# Patient Record
Sex: Female | Born: 1945 | Race: White | Marital: Married | Smoking: Former smoker
Health system: Southern US, Community
[De-identification: ages and names within clinical notes are randomized; demographics above are authoritative.]

## PROBLEM LIST (undated history)

## (undated) DIAGNOSIS — Z87898 Personal history of other specified conditions: Secondary | ICD-10-CM

## (undated) DIAGNOSIS — T7840XA Allergy, unspecified, initial encounter: Secondary | ICD-10-CM

## (undated) DIAGNOSIS — J189 Pneumonia, unspecified organism: Secondary | ICD-10-CM

## (undated) DIAGNOSIS — C801 Malignant (primary) neoplasm, unspecified: Secondary | ICD-10-CM

## (undated) HISTORY — DX: Personal history of other specified conditions: Z87.898

## (undated) HISTORY — DX: Allergy, unspecified, initial encounter: T78.40XA

## (undated) HISTORY — PX: COLPOSCOPY: SHX161

---

## 2008-02-03 ENCOUNTER — Ambulatory Visit: Payer: Self-pay | Admitting: Gastroenterology

## 2011-10-16 ENCOUNTER — Emergency Department: Payer: Self-pay | Admitting: Emergency Medicine

## 2011-10-16 LAB — TROPONIN I: Troponin-I: <0.02 ng/mL

## 2011-10-16 LAB — COMPREHENSIVE METABOLIC PANEL
Albumin: 3.3 g/dL — ABNORMAL LOW (ref 3.4–5.0)
BUN: 8 mg/dL (ref 7–18)
Calcium, Total: 8.3 mg/dL — ABNORMAL LOW (ref 8.5–10.1)
Chloride: 100 mmol/L (ref 98–107)
Co2: 23 mmol/L (ref 21–32)
Creatinine: 0.62 mg/dL (ref 0.60–1.30)
Osmolality: 271 (ref 275–301)
Potassium: 3.3 mmol/L — ABNORMAL LOW (ref 3.5–5.1)
Sodium: 134 mmol/L — ABNORMAL LOW (ref 136–145)
Total Protein: 6.9 g/dL (ref 6.4–8.2)

## 2011-10-16 LAB — CBC
MCH: 33 pg (ref 26.0–34.0)
MCHC: 34.2 g/dL (ref 32.0–36.0)
RBC: 3.73 10*6/uL — ABNORMAL LOW (ref 3.80–5.20)
RDW: 12.5 % (ref 11.5–14.5)
WBC: 8.5 10*3/uL (ref 3.6–11.0)

## 2011-10-22 LAB — CULTURE, BLOOD (SINGLE)

## 2012-06-11 ENCOUNTER — Encounter: Payer: Self-pay | Admitting: Obstetrics and Gynecology

## 2012-06-11 ENCOUNTER — Ambulatory Visit (INDEPENDENT_AMBULATORY_CARE_PROVIDER_SITE_OTHER): Payer: Medicare PPO | Admitting: Obstetrics and Gynecology

## 2012-06-11 VITALS — BP 151/71 | HR 84 | Ht 64.0 in | Wt 100.0 lb

## 2012-06-11 DIAGNOSIS — N644 Mastodynia: Secondary | ICD-10-CM

## 2012-06-11 NOTE — Progress Notes (Signed)
Patient ID: Deborah Harrison, female   DOB: [birth year], 67 y.o.   MRN: [id number] 67 yo recently relocated to [facility] for retirement presenting today for evaluation of left breast pain. Patient reports onset of burning sensation 2 weeks ago which occurs mainly at night or when sitting in a certain position. Patient is otherwise doing well and is without any complaints,  Past Medical History  Diagnosis Date  . Allergy     seasonal  . History of abnormal Pap smear     10 years ago.   Past Surgical History  Procedure Date  . Colposcopy     abnormal pap x 59yrs ago.   Family History  Problem Relation Age of Onset  . Heart disease Mother   . Heart disease Father     heart attack.  . Diabetes Paternal Grandmother    History  Substance Use Topics  . Smoking status: Former Games developer  . Smokeless tobacco: Not on file     Comment: 20yrs ago.  . Alcohol Use: Yes     Comment: social   GENERAL: Well-developed, well-nourished female in no acute distress.  BREASTS: Symmetric in size. No palpable masses or lymphadenopathy, skin changes, or nipple drainage. ABDOMEN: Soft, nontender, nondistended. No organomegaly. EXTREMITIES: No cyanosis, clubbing, or edema, 2+ distal pulses.  A/P 67 yo with left breast pain - Benign breast exam. Mammogram ordered - patient will return for annual exam with [place] or will have it performed by PCP -RTC prn

## 2012-06-22 ENCOUNTER — Encounter: Payer: Self-pay | Admitting: Obstetrics and Gynecology

## 2012-07-18 ENCOUNTER — Other Ambulatory Visit: Payer: Self-pay

## 2013-01-06 ENCOUNTER — Other Ambulatory Visit: Payer: Self-pay

## 2013-04-08 ENCOUNTER — Other Ambulatory Visit: Payer: Self-pay

## 2017-02-12 ENCOUNTER — Other Ambulatory Visit: Payer: Self-pay | Admitting: Family Medicine

## 2017-02-12 DIAGNOSIS — Z1239 Encounter for other screening for malignant neoplasm of breast: Secondary | ICD-10-CM

## 2021-03-06 ENCOUNTER — Other Ambulatory Visit: Payer: Self-pay | Admitting: Family Medicine

## 2021-03-06 DIAGNOSIS — Z1231 Encounter for screening mammogram for malignant neoplasm of breast: Secondary | ICD-10-CM

## 2021-06-26 ENCOUNTER — Other Ambulatory Visit: Payer: Self-pay | Admitting: Surgery

## 2021-06-26 ENCOUNTER — Ambulatory Visit
Admission: RE | Admit: 2021-06-26 | Discharge: 2021-06-26 | Disposition: A | Discharge status: Home / Self Care | Payer: Medicare PPO | Source: Ambulatory Visit | Attending: Surgery | Admitting: Surgery

## 2021-06-26 ENCOUNTER — Ambulatory Visit: Payer: Medicare PPO

## 2021-06-26 DIAGNOSIS — S52531A Colles' fracture of right radius, initial encounter for closed fracture: Secondary | ICD-10-CM

## 2021-06-29 ENCOUNTER — Other Ambulatory Visit
Admission: RE | Admit: 2021-06-29 | Discharge: 2021-06-29 | Disposition: A | Discharge status: Home / Self Care | Payer: Medicare PPO | Source: Ambulatory Visit | Attending: Surgery | Admitting: Surgery

## 2021-06-29 ENCOUNTER — Other Ambulatory Visit: Payer: Self-pay

## 2021-06-29 ENCOUNTER — Other Ambulatory Visit: Payer: Self-pay | Admitting: Surgery

## 2021-06-29 HISTORY — DX: Malignant (primary) neoplasm, unspecified: C80.1

## 2021-06-29 HISTORY — DX: Pneumonia, unspecified organism: J18.9

## 2021-06-29 NOTE — Patient Instructions (Addendum)
Your procedure is scheduled on: 07/03/21 - Tuesday Report to the Registration Desk on the 1st floor of the [facility]. To find out your arrival time, please call [phone] between 1PM - 3PM on: 07/02/21 - Monday  REMEMBER: Instructions that are not followed completely may result in serious medical risk, up to and including death; or upon the discretion of your surgeon and anesthesiologist your surgery may need to be rescheduled.  Do not eat food after midnight the night before surgery.  No gum chewing, lozengers or hard candies.  You may however, drink CLEAR liquids up to 2 hours before you are scheduled to arrive for your surgery. Do not drink anything within 2 hours of your scheduled arrival time.  Clear liquids include: - water  - apple juice without pulp - gatorade (not RED, PURPLE, OR BLUE) - black coffee or tea (Do NOT add milk or creamers to the coffee or tea) Do NOT drink anything that is not on this list.  TAKE THESE MEDICATIONS THE MORNING OF SURGERY WITH A SIP OF WATER: NONE  One week prior to surgery: Stop Anti-inflammatories (NSAIDS) such as Advil, Aleve, Ibuprofen, Motrin, Naproxen, Naprosyn and Aspirin based products such as Excedrin, Goodys Powder, BC Powder.  Stop ANY OVER THE COUNTER supplements until after surgery.  You may however, continue to take Tylenol if needed for pain up until the day of surgery.  No Alcohol for 24 hours before or after surgery.  No Smoking including e-cigarettes for 24 hours prior to surgery.  No chewable tobacco products for at least 6 hours prior to surgery.  No nicotine patches on the day of surgery.  Do not use any "recreational" drugs for at least a week prior to your surgery.  Please be advised that the combination of cocaine and anesthesia may have negative outcomes, up to and including death. If you test positive for cocaine, your surgery will be cancelled.  On the morning of surgery brush your teeth with toothpaste and  water, you may rinse your mouth with mouthwash if you wish. Do not swallow any toothpaste or mouthwash.  Do not wear jewelry, make-up, hairpins, clips or nail polish.  Do not wear lotions, powders, or perfumes.   Do not shave body from the neck down 48 hours prior to surgery just in case you cut yourself which could leave a site for infection.  Also, freshly shaved skin may become irritated if using the CHG soap.  Contact lenses, hearing aids and dentures may not be worn into surgery.  Do not bring valuables to the hospital. [facility] is not responsible for any missing/lost belongings or valuables.   Notify your doctor if there is any change in your medical condition (cold, fever, infection).  Wear comfortable clothing (specific to your surgery type) to the hospital.  After surgery, you can help prevent lung complications by doing breathing exercises.  Take deep breaths and cough every 1-2 hours. Your doctor may order a device called an Incentive Spirometer to help you take deep breaths. When coughing or sneezing, hold a pillow firmly against your incision with both hands. This is called splinting. Doing this helps protect your incision. It also decreases belly discomfort.  If you are being admitted to the hospital overnight, leave your suitcase in the car. After surgery it may be brought to your room.  If you are being discharged the day of surgery, you will not be allowed to drive home. You will need a responsible adult (18 years  or older) to drive you home and stay with you that night.   If you are taking public transportation, you will need to have a responsible adult (18 years or older) with you. Please confirm with your physician that it is acceptable to use public transportation.   Please call the [facility] Dept. at [phone] if you have any questions about these instructions.  Surgery Visitation Policy:  Patients undergoing a surgery or procedure may  have one family member or support person with them as long as that person is not COVID-19 positive or experiencing its symptoms.  That person may remain in the waiting area during the procedure and may rotate out with other people.  Inpatient Visitation:    Visiting hours are 7 a.m. to 8 p.m. Up to two visitors ages 16+ are allowed at one time in a patient room. The visitors may rotate out with other people during the day. Visitors must check out when they leave, or other visitors will not be allowed. One designated support person may remain overnight. The visitor must pass COVID-19 screenings, use hand sanitizer when entering and exiting the patients room and wear a mask at all times, including in the patients room. Patients must also wear a mask when staff or their visitor are in the room. Masking is required regardless of vaccination status.

## 2021-07-03 ENCOUNTER — Ambulatory Visit: Payer: Medicare PPO | Admitting: Certified Registered Nurse Anesthetist

## 2021-07-03 ENCOUNTER — Encounter: Payer: Self-pay | Admitting: Orthopedic Surgery

## 2021-07-03 ENCOUNTER — Ambulatory Visit
Admission: RE | Admit: 2021-07-03 | Discharge: 2021-07-03 | Disposition: A | Discharge status: Home / Self Care | Payer: Medicare PPO | Source: Ambulatory Visit | Attending: Orthopedic Surgery | Admitting: Orthopedic Surgery

## 2021-07-03 ENCOUNTER — Other Ambulatory Visit: Payer: Self-pay

## 2021-07-03 ENCOUNTER — Ambulatory Visit: Payer: Medicare PPO

## 2021-07-03 ENCOUNTER — Encounter
Admission: RE | Disposition: A | Discharge status: Home / Self Care | Payer: Self-pay | Source: Ambulatory Visit | Attending: Orthopedic Surgery

## 2021-07-03 DIAGNOSIS — X58XXXA Exposure to other specified factors, initial encounter: Secondary | ICD-10-CM | POA: Insufficient documentation

## 2021-07-03 DIAGNOSIS — S52531A Colles' fracture of right radius, initial encounter for closed fracture: Secondary | ICD-10-CM | POA: Insufficient documentation

## 2021-07-03 DIAGNOSIS — S52691A Other fracture of lower end of right ulna, initial encounter for closed fracture: Secondary | ICD-10-CM | POA: Insufficient documentation

## 2021-07-03 DIAGNOSIS — M810 Age-related osteoporosis without current pathological fracture: Secondary | ICD-10-CM | POA: Diagnosis not present

## 2021-07-03 DIAGNOSIS — Z9889 Other specified postprocedural states: Secondary | ICD-10-CM

## 2021-07-03 DIAGNOSIS — Z87891 Personal history of nicotine dependence: Secondary | ICD-10-CM | POA: Diagnosis not present

## 2021-07-03 DIAGNOSIS — W010XXA Fall on same level from slipping, tripping and stumbling without subsequent striking against object, initial encounter: Secondary | ICD-10-CM | POA: Diagnosis not present

## 2021-07-03 HISTORY — PX: ORIF WRIST FRACTURE: SHX2133

## 2021-07-03 SURGERY — OPEN REDUCTION INTERNAL FIXATION (ORIF) WRIST FRACTURE
Anesthesia: General | Site: Wrist | Laterality: Right

## 2021-07-03 MED ORDER — GLYCOPYRROLATE 0.2 MG/ML IJ SOLN
INTRAMUSCULAR | Status: DC | PRN
  Administered 2021-07-03: .2 mg via INTRAVENOUS

## 2021-07-03 MED ORDER — DEXAMETHASONE SODIUM PHOSPHATE 10 MG/ML IJ SOLN
INTRAMUSCULAR | Status: DC | PRN
  Administered 2021-07-03: 5 mg via INTRAVENOUS

## 2021-07-03 MED ORDER — HYDROCODONE-ACETAMINOPHEN 5-325 MG PO TABS: 1.0000 | ORAL_TABLET | Freq: Four times a day (QID) | ORAL | 0 refills | Status: DC | PRN

## 2021-07-03 MED ORDER — CLINDAMYCIN PHOSPHATE 600 MG/50ML IV SOLN
INTRAVENOUS | Status: AC
  Filled 2021-07-03: qty 50

## 2021-07-03 MED ORDER — ORAL CARE MOUTH RINSE: 15.0000 mL | Freq: Once | OROMUCOSAL | Status: AC

## 2021-07-03 MED ORDER — ONDANSETRON HCL 4 MG/2ML IJ SOLN
INTRAMUSCULAR | Status: DC | PRN
  Administered 2021-07-03: 4 mg via INTRAVENOUS

## 2021-07-03 MED ORDER — OXYCODONE HCL 5 MG PO TABS
ORAL_TABLET | ORAL | Status: AC
  Administered 2021-07-03: 5 mg via ORAL
  Filled 2021-07-03: qty 1

## 2021-07-03 MED ORDER — OXYCODONE HCL 5 MG PO TABS: 5.0000 mg | ORAL_TABLET | ORAL | Status: AC

## 2021-07-03 MED ORDER — LIDOCAINE HCL (CARDIAC) PF 100 MG/5ML IV SOSY
PREFILLED_SYRINGE | INTRAVENOUS | Status: DC | PRN
  Administered 2021-07-03: 50 mg via INTRAVENOUS

## 2021-07-03 MED ORDER — GLYCOPYRROLATE 0.2 MG/ML IJ SOLN
INTRAMUSCULAR | Status: AC
  Filled 2021-07-03: qty 1

## 2021-07-03 MED ORDER — FENTANYL CITRATE (PF) 100 MCG/2ML IJ SOLN
INTRAMUSCULAR | Status: DC | PRN
  Administered 2021-07-03 (×4): 25 ug via INTRAVENOUS

## 2021-07-03 MED ORDER — PROPOFOL 10 MG/ML IV BOLUS
INTRAVENOUS | Status: DC | PRN
  Administered 2021-07-03: 100 mg via INTRAVENOUS

## 2021-07-03 MED ORDER — ONDANSETRON HCL 4 MG/2ML IJ SOLN
INTRAMUSCULAR | Status: AC
  Filled 2021-07-03: qty 2

## 2021-07-03 MED ORDER — ONDANSETRON HCL 4 MG/2ML IJ SOLN
4.0000 mg | Freq: Once | INTRAMUSCULAR | Status: AC | PRN
  Administered 2021-07-03: 4 mg via INTRAVENOUS

## 2021-07-03 MED ORDER — PHENYLEPHRINE HCL (PRESSORS) 10 MG/ML IV SOLN
INTRAVENOUS | Status: DC | PRN
  Administered 2021-07-03: 100 ug via INTRAVENOUS

## 2021-07-03 MED ORDER — 0.9 % SODIUM CHLORIDE (POUR BTL) OPTIME
TOPICAL | Status: DC | PRN
  Administered 2021-07-03: 75 mL

## 2021-07-03 MED ORDER — FENTANYL CITRATE (PF) 100 MCG/2ML IJ SOLN
INTRAMUSCULAR | Status: AC
  Administered 2021-07-03: 25 ug via INTRAVENOUS
  Filled 2021-07-03: qty 2

## 2021-07-03 MED ORDER — LIDOCAINE HCL (PF) 2 % IJ SOLN
INTRAMUSCULAR | Status: AC
  Filled 2021-07-03: qty 5

## 2021-07-03 MED ORDER — CHLORHEXIDINE GLUCONATE 0.12 % MT SOLN: 15.0000 mL | Freq: Once | OROMUCOSAL | Status: AC

## 2021-07-03 MED ORDER — ONDANSETRON 4 MG PO TBDP: 4.0000 mg | ORAL_TABLET | Freq: Three times a day (TID) | ORAL | 1 refills | Status: DC | PRN

## 2021-07-03 MED ORDER — CLINDAMYCIN PHOSPHATE 900 MG/50ML IV SOLN
INTRAVENOUS | Status: AC
  Filled 2021-07-03: qty 50

## 2021-07-03 MED ORDER — PROPOFOL 500 MG/50ML IV EMUL
INTRAVENOUS | Status: DC | PRN
  Administered 2021-07-03: 75 ug/kg/min via INTRAVENOUS

## 2021-07-03 MED ORDER — LACTATED RINGERS IV SOLN: INTRAVENOUS | Status: DC

## 2021-07-03 MED ORDER — KETOROLAC TROMETHAMINE 30 MG/ML IJ SOLN
INTRAMUSCULAR | Status: AC
  Filled 2021-07-03: qty 1

## 2021-07-03 MED ORDER — ACETAMINOPHEN 10 MG/ML IV SOLN
INTRAVENOUS | Status: DC | PRN
  Administered 2021-07-03: 750 mg via INTRAVENOUS

## 2021-07-03 MED ORDER — DEXMEDETOMIDINE (PRECEDEX) IN NS 20 MCG/5ML (4 MCG/ML) IV SYRINGE
PREFILLED_SYRINGE | INTRAVENOUS | Status: DC | PRN
  Administered 2021-07-03 (×3): 4 ug via INTRAVENOUS

## 2021-07-03 MED ORDER — FENTANYL CITRATE (PF) 100 MCG/2ML IJ SOLN
25.0000 ug | INTRAMUSCULAR | Status: DC | PRN
  Administered 2021-07-03 (×4): 25 ug via INTRAVENOUS

## 2021-07-03 MED ORDER — FAMOTIDINE 20 MG PO TABS
ORAL_TABLET | ORAL | Status: AC
  Filled 2021-07-03: qty 1

## 2021-07-03 MED ORDER — ACETAMINOPHEN 10 MG/ML IV SOLN
INTRAVENOUS | Status: AC
  Filled 2021-07-03: qty 100

## 2021-07-03 MED ORDER — CLINDAMYCIN PHOSPHATE 900 MG/50ML IV SOLN
900.0000 mg | INTRAVENOUS | Status: AC
  Administered 2021-07-03: 900 mg via INTRAVENOUS

## 2021-07-03 MED ORDER — CHLORHEXIDINE GLUCONATE 0.12 % MT SOLN
OROMUCOSAL | Status: AC
  Administered 2021-07-03: 15 mL via OROMUCOSAL
  Filled 2021-07-03: qty 15

## 2021-07-03 MED ORDER — KETOROLAC TROMETHAMINE 15 MG/ML IJ SOLN
15.0000 mg | Freq: Once | INTRAMUSCULAR | Status: AC
  Administered 2021-07-03: 15 mg via INTRAVENOUS

## 2021-07-03 MED ORDER — FAMOTIDINE 20 MG PO TABS: 20.0000 mg | ORAL_TABLET | Freq: Once | ORAL | Status: DC

## 2021-07-03 MED ORDER — FENTANYL CITRATE (PF) 100 MCG/2ML IJ SOLN
INTRAMUSCULAR | Status: AC
  Filled 2021-07-03: qty 2

## 2021-07-03 SURGICAL SUPPLY — 45 items
APL PRP STRL LF DISP 70% ISPRP (MISCELLANEOUS) ×1
BIT DRILL 2 FAST STEP (BIT) ×1 IMPLANT
BIT DRILL 2.5X4 QC (BIT) ×1 IMPLANT
BNDG CMPR STD VLCR NS LF 5.8X4 (GAUZE/BANDAGES/DRESSINGS) ×1
BNDG ELASTIC 4X5.8 VLCR NS LF (GAUZE/BANDAGES/DRESSINGS) ×2 IMPLANT
BNDG ELASTIC 4X5.8 VLCR STR LF (GAUZE/BANDAGES/DRESSINGS) ×1 IMPLANT
CHLORAPREP W/TINT 26 (MISCELLANEOUS) ×2 IMPLANT
CUFF TOURN SGL QUICK 18X4 (TOURNIQUET CUFF) IMPLANT
DRAPE FLUOR MINI C-ARM 54X84 (DRAPES) ×2 IMPLANT
ELECT CAUTERY BLADE 6.4 (BLADE) ×2 IMPLANT
ELECT REM PT RETURN 9FT ADLT (ELECTROSURGICAL) ×2
ELECTRODE REM PT RTRN 9FT ADLT (ELECTROSURGICAL) ×1 IMPLANT
GAUZE SPONGE 4X4 12PLY STRL (GAUZE/BANDAGES/DRESSINGS) ×2 IMPLANT
GAUZE XEROFORM 1X8 LF (GAUZE/BANDAGES/DRESSINGS) ×2 IMPLANT
GLOVE SURG SYN 9.0  PF PI (GLOVE) ×1
GLOVE SURG SYN 9.0 PF PI (GLOVE) ×1 IMPLANT
GOWN SRG 2XL LVL 4 RGLN SLV (GOWNS) ×1 IMPLANT
GOWN STRL NON-REIN 2XL LVL4 (GOWNS) ×2
GOWN STRL REUS W/ TWL LRG LVL3 (GOWN DISPOSABLE) ×1 IMPLANT
GOWN STRL REUS W/TWL LRG LVL3 (GOWN DISPOSABLE) ×2
K-WIRE 1.6 (WIRE) ×2
K-WIRE FX5X1.6XNS BN SS (WIRE) ×1
KIT TURNOVER KIT A (KITS) ×2 IMPLANT
KWIRE FX5X1.6XNS BN SS (WIRE) IMPLANT
MANIFOLD NEPTUNE II (INSTRUMENTS) ×2 IMPLANT
NDL FILTER BLUNT 18X1 1/2 (NEEDLE) ×1 IMPLANT
NEEDLE FILTER BLUNT 18X 1/2SAF (NEEDLE)
NEEDLE FILTER BLUNT 18X1 1/2 (NEEDLE) IMPLANT
NS IRRIG 500ML POUR BTL (IV SOLUTION) ×2 IMPLANT
PACK EXTREMITY ARMC (MISCELLANEOUS) ×2 IMPLANT
PAD CAST CTTN 4X4 STRL (SOFTGOODS) ×1 IMPLANT
PADDING CAST COTTON 4X4 STRL (SOFTGOODS) ×2
PEG SUBCHONDRAL SMOOTH 2.0X14 (Peg) ×2 IMPLANT
PEG SUBCHONDRAL SMOOTH 2.0X16 (Peg) ×1 IMPLANT
PEG SUBCHONDRAL SMOOTH 2.0X18 (Peg) ×2 IMPLANT
PEG SUBCHONDRAL SMOOTH 2.0X20 (Peg) ×1 IMPLANT
PLATE STAN 21.6X57.2 NRRW RT (Plate) ×1 IMPLANT
SCALPEL PROTECTED #15 DISP (BLADE) ×4 IMPLANT
SCREW CORT 3.5X10 LNG (Screw) ×3 IMPLANT
SPLINT CAST 1 STEP 3X12 (MISCELLANEOUS) ×2 IMPLANT
SUT ETHILON 4-0 (SUTURE) ×2
SUT ETHILON 4-0 FS2 18XMFL BLK (SUTURE) ×1
SUT VICRYL 3-0 27IN (SUTURE) ×2 IMPLANT
SUTURE ETHLN 4-0 FS2 18XMF BLK (SUTURE) ×1 IMPLANT
SYR 3ML LL SCALE MARK (SYRINGE) ×1 IMPLANT

## 2021-07-03 NOTE — H&P (Signed)
Chief Complaint  Patient presents with   Right Wrist Fracture  [place] [address] on 06/21/2021   Deborah Harrison is a 76 y.o. female who presents today for evaluation of a right wrist injury sustained on 06/21/2021. The patient was outside walking for exercise when she stepped in a pothole, she slipped and fell. When she felt the patient believes she may have landed on her right wrist and she had immediate pain and discomfort. The patient did not lose consciousness, she did not feel a pop in the right wrist. She was evaluated at the walk-in clinic where x-rays of the right wrist demonstrated a displaced fracture involving the distal radius with dorsal angulation. There also appeared to be a minimally displaced scaphoid fracture and ulnar styloid fracture. The patient was placed in a sugar-tong splint and instructed to follow-up orthopedics. The patient denies any numbness or tingling to the right upper extremity. She denies any previous injury to the right wrist or arm. The patient denies any surgical history to the right wrist. She is right-hand dominant. She does still exercise on a routine basis, she drives herself and also does yoga. She states that she uses her right hand for a significant mount of activities during today. The patient denies any personal history of heart attack, stroke, asthma or COPD. No personal history of blood clots. The patient does have a history of osteoporosis.  Answers for HPI/ROS submitted by the patient on 06/26/2021 How did your symptoms begin?: suddenly with injury How long ago did your symptoms begin?: 5 Days Please indicate all symptoms related to your issue: pain, swelling Where is your pain located? (select all that apply): right wrist Please select what best describes your pain (choose all that apply): aching, throbbing Please choose the severity that best describes your pain: moderate Rate the pain on a scale of 0 (no pain) to 10 (worst pain ever): 5/10 How often does the pain  occur?: constant Since the onset of your problem, has the pain improved, worsened, or stayed the same?: about the same as when symptoms started Do your symptoms wake you from sleep?: Yes Please choose all the items that worsen your symptoms: activity with limb Please choose all the items that improve your symptoms: acetaminophen, cast, elevation, ice Please indicate any studies you've had for this problem (select all that apply): X-rays When was the date of your injury?: 06/21/2021 Is this a work-related injury?: No Have you filed a worker's comp claim?: No  Past Medical History: Past Medical History:  Diagnosis Date   Chicken pox   Mitral valve prolapse   Osteoporosis   Viral pneumonia  History of lung collapse   Past Surgical History: Past Surgical History:  Procedure Laterality Date   TONSILLECTOMY   Past Family History: Family History  Problem Relation Age of Onset   High blood pressure (Hypertension) Mother   Osteoporosis (Thinning of bones) Mother   Coronary Artery Disease (Blocked arteries around heart) Father   Myocardial Infarction (Heart attack) Father   Breast cancer Maternal Grandmother   Diabetes Paternal Grandmother   Breast cancer Paternal Grandmother   Medications: Current Outpatient Medications Ordered in Epic  Medication Sig Dispense Refill   acetaminophen (TYLENOL) 325 MG tablet Take 650 mg by mouth as needed for Pain   cholecalciferol (VITAMIN D3) 2,000 unit capsule Take 2,000 Units by mouth once daily   diphenhydrAMINE (BENADRYL) 25 mg capsule Take 25 mg by mouth once daily as needed for Itching.   hawthorn 500 mg Cap  Take 1 capsule by mouth once daily.   No current Epic-ordered facility-administered medications on file.   Allergies: Allergies  Allergen Reactions   Penicillins Rash   Erythromycin Other (See Comments)  Other reaction(s): GI Intolerance   Penicillin G Unknown    Review of Systems:  A comprehensive 14 point ROS was performed,  reviewed by me today, and the pertinent orthopaedic findings are documented in the HPI.  Exam: BP (!) 162/86   Ht 162.6 cm (5\' 4" )   Wt (!) 43.2 kg (95 lb 3.2 oz)   BMI 16.34 kg/m  General/Constitutional: The patient appears to be well-nourished, well-developed, and in no acute distress. Neuro/Psych: Normal mood and affect, oriented to person, place and time. Eyes: Non-icteric. Pupils are equal, round, and reactive to light, and exhibit synchronous movement. ENT: Unremarkable. Lymphatic: No palpable adenopathy. Respiratory: Lungs clear to auscultation, Normal chest excursion, No wheezes and Non-labored breathing Cardiovascular: Regular rate and rhythm. No murmurs. and No edema, swelling or tenderness, except as noted in detailed exam. Integumentary: No impressive skin lesions present, except as noted in detailed exam. Musculoskeletal: Unremarkable, except as noted in detailed exam.  The patient presents today with the sugar-tong splint intact to the right upper extremity. Splint was removed at today's visit. Skin examination of the right arm does reveal moderate ecchymosis throughout the dorsal aspect of the wrist and along the ulnar aspect of the forearm. There does appear to be a small wound along the lateral aspect of the elbow which appears to be the beginning stages of a pressure type injury over the lateral epicondyle. No break of the skin is identified, mild erythema is noted. She is able to fully flex and extend the right elbow without discomfort. She does report tenderness to palpation along the distal radius and along the distal ulna. The patient does have some mild discomfort palpation along the anatomic snuffbox. Increased discomfort with gentle thumb flexion and extension exercises. The patient is able to pronate the right forearm without any discomfort, limited supination. Wrist flexion and extension was not evaluated at today's visit. Cap refills intact each individual digit. The  patient is intact light touch throughout the right upper extremity, no evidence of acute carpal tunnel syndrome. Forearm is soft to palpation, no evidence of acute compartment syndrome. Radial pulses intact to the right wrist. The patient was placed in a short arm thumb spica cast prior to leaving today's visit.  Imaging: AP, lateral and oblique images of the right wrist with scaphoid views were obtained today in the office and reviewed by me. These x-rays demonstrate evidence of a dorsally angulated, impacted and displaced intra-articular distal radius fracture. There does not appear to be any significant shifting since initial x-rays. The patient also has evidence of a nondisplaced ulnar styloid fracture. On scaphoid view there does appear to be a at most minimally displaced mid body fracture of the scaphoid however this is not visible on every view.  Impression: Closed Colles' fracture of right radius, initial encounter [S52.531A] Closed Colles' fracture of right radius, initial encounter (primary encounter diagnosis) Other closed fracture of distal end of right ulna, initial encounter Closed nondisplaced fracture of middle third of scaphoid bone of right wrist, initial encounter  Plan:  1. Treatment options were discussed today with the patient. 2. X-rays of the right wrist with scaphoid view does reveal evidence of a displaced and impacted distal radius fracture and questionable scaphoid fracture. 3. The patient was offered and placed in a short arm thumb  spica cast for additional protection following today's visit. Stat CT scan of the right wrist has been ordered for evaluation of scaphoid fracture. 4. The patient is right-hand dominant and is extremely active, discussed the risk and benefits of both surgical intervention and nonsurgical intervention for the wrist fracture. 5. After discussion of the risk and benefits the patient would like to proceed with a right distal radius fracture open  reduction internal fixation, if CT scan is positive for a scaphoid fracture we will also proceed with a percutaneous screw fixation of scaphoid fracture. Surgery will be performed by Dr. Roland Rack, plan will be to schedule this patient for surgery next Tuesday, 07/03/2021. 6. This document will serve as a surgical history and physical for the patient. 7. The patient will follow-up per standard postop protocol. They can call the clinic they have any questions, new symptoms develop or symptoms worsen.  The procedure was discussed with the patient, as were the potential risks (including bleeding, infection, nerve and/or blood vessel injury, persistent or recurrent pain, failure of the repair, nonunion, progression of arthritis, need for further surgery, blood clots, strokes, heart attacks and/or arhythmias, pneumonia, etc.) and benefits. The patient states her understanding and wishes to proceed.  Addendum 5:01pm on 06/26/21 CT scan of the right wrist was negative for evidence of acute scaphoid fracture. Called and spoke with the patient and relayed the imaging report. Discussed with the patient plan will be to proceed with open reduction total fixation of the right distal radius fracture with Dr. Roland Rack. The percutaneous screw fixation that was discussed at her visit will not be necessary.  This office visit took 60 minutes, of which >50% involved patient counseling/education.  Review of the [place] CSRS was performed in accordance of the [place] prior to dispensing any controlled drugs.  This note was generated in part with voice recognition software and I apologize for any typographical errors that were not detected and corrected.  Raquel James, PA-C, CAQ-OS [place]  Electronically signed by Lattie Corns, PA at 06/26/2021 5:04 PM EST  Reviewed  H+P. CT scan showed no scaphoid fracture.  Because of emergencies from last night for Dr. Roland Rack I will be performing the surgery.

## 2021-07-03 NOTE — Op Note (Signed)
07/03/2021  1:10 PM  PATIENT:  Deborah Harrison  76 y.o. female  PRE-OPERATIVE DIAGNOSIS:  Closed Colles' fracture of right radius, initial encounter S52.531A Other closed fracture of distal end of right ulna, initial encounter S52.691A   POST-OPERATIVE DIAGNOSIS:  Closed Colles' fracture of right radius, initial encounter S52.531A Other closed fracture of distal end of right ulna, initial encounter S52.691A  PROCEDURE:  Procedure(s): OPEN REDUCTION INTERNAL FIXATION (ORIF) WRIST FRACTURE (Right)  SURGEON: Laurene Footman, MD  ASSISTANTS: None  ANESTHESIA:   general  EBL:  Total I/O In: 650 [I.V.:600; IV Piggyback:50] Out: 20 [Blood:20]  BLOOD ADMINISTERED:none  DRAINS: none   LOCAL MEDICATIONS USED:  NONE  SPECIMEN:  No Specimen  DISPOSITION OF SPECIMEN:  N/A  COUNTS:  YES  TOURNIQUET:   Total Tourniquet Time Documented: Upper Arm (Right) - 17 minutes Total: Upper Arm (Right) - 17 minutes   IMPLANTS: [employer] innovations narrow standard right plate with multiple smooth pegs and screws  DICTATION: .Dragon Dictation patient was brought to the operating room and after adequate general anesthesia was obtained the right arm was prepped and draped in the usual sterile fashion with a tourniquet applied the upper arm.  After patient identification and timeout procedures were completed tourniquet was raised and a volar approach made centered over the FCR tendon.  Tendon sheath incised and the tendon retracted radially with the radial artery and veins with deep fascia incised and the pronator elevated and used to help retract the ulnar-sided structures with deep retractor placed.  Periosteal elevator used to expose distal and proximal fragments.  Fingertrap traction was applied to the start of the case with 10 pounds off the end of the table this helped restore length a distal first approach was utilized first pinning the plate in position distally ensure is in the appropriate spot and then  filling the 6 distal peg holes with smooth pegs care being taken with multiple views to not penetrate the joint.  After these been placed the plate was brought down to the shaft with a 4 hole plate used but only 3 holes required.  The screw holes were drilled and then measured and 10 mm cortical screw was placed.  This gave essentially anatomic alignment with restoration of volar tilt and radial inclination as well as length.  The traction was removed and under fluoroscopic views there is stability of the fracture with range of motion and no penetration of any pegs into the joint.  The wound is irrigated and tourniquet let down there is minimal bleeding the wound was closed with 3-0 Vicryl subcutaneously and 4-0 nylon for the skin followed by Xeroform 4 x 4 web roll volar splint and Ace wrap  PLAN OF CARE: Discharge to home after PACU  PATIENT DISPOSITION:  PACU - hemodynamically stable.

## 2021-07-03 NOTE — Anesthesia Postprocedure Evaluation (Signed)
Anesthesia Post Note  Patient: Deborah Harrison  Procedure(s) Performed: OPEN REDUCTION INTERNAL FIXATION (ORIF) WRIST FRACTURE (Right: Wrist)  Patient location during evaluation: PACU Anesthesia Type: General Level of consciousness: awake and alert Pain management: pain level controlled Vital Signs Assessment: post-procedure vital signs reviewed and stable Respiratory status: spontaneous breathing, nonlabored ventilation, respiratory function stable and patient connected to nasal cannula oxygen Cardiovascular status: blood pressure returned to baseline and stable Postop Assessment: no apparent nausea or vomiting Anesthetic complications: no   No notable events documented.   Last Vitals:  Vitals:   07/03/21 1415 07/03/21 1433  BP: 132/73 (!) 156/77  Pulse: 76 61  Resp: 13 16  Temp: 36.8 C (!) 36.1 C  SpO2: 98% 96%    Last Pain:  Vitals:   07/03/21 1433  TempSrc: Temporal  PainSc: [name]

## 2021-07-03 NOTE — Anesthesia Procedure Notes (Signed)
Procedure Name: LMA Insertion Date/Time: 07/03/2021 12:22 PM Performed by: Bea Graff, RN Pre-anesthesia Checklist: Patient identified, Patient being monitored, Timeout performed, Emergency Drugs available and Suction available Patient Re-evaluated:Patient Re-evaluated prior to induction Oxygen Delivery Method: Circle system utilized Preoxygenation: Pre-oxygenation with 100% oxygen Induction Type: IV induction Ventilation: Mask ventilation without difficulty LMA: LMA inserted LMA Size: 3.0 Tube type: Oral Number of attempts: 1 Placement Confirmation: positive ETCO2 and breath sounds checked- equal and bilateral Tube secured with: Tape Dental Injury: Teeth and Oropharynx as per pre-operative assessment

## 2021-07-03 NOTE — Anesthesia Preprocedure Evaluation (Signed)
Anesthesia Evaluation  Patient identified by MRN, date of birth, ID band Patient awake    Reviewed: Allergy & Precautions, H&P , NPO status , Patient's Chart, lab work & pertinent test results, reviewed documented beta blocker date and time   Airway Mallampati: II  TM Distance: >3 FB Neck ROM: full    Dental  (+) Teeth Intact   Pulmonary neg pulmonary ROS, pneumonia, resolved, former smoker,    Pulmonary exam normal        Cardiovascular Exercise Tolerance: Good negative cardio ROS Normal cardiovascular exam Rate:Normal     Neuro/Psych negative neurological ROS  negative psych ROS   GI/Hepatic negative GI ROS, Neg liver ROS,   Endo/Other  negative endocrine ROS  Renal/GU negative Renal ROS  negative genitourinary   Musculoskeletal   Abdominal   Peds  Hematology negative hematology ROS (+)   Anesthesia Other Findings   Reproductive/Obstetrics negative OB ROS                             Anesthesia Physical Anesthesia Plan  ASA: 2  Anesthesia Plan: General LMA   Post-op Pain Management:    Induction:   PONV Risk Score and Plan: 4 or greater  Airway Management Planned:   Additional Equipment:   Intra-op Plan:   Post-operative Plan:   Informed Consent: I have reviewed the patients History and Physical, chart, labs and discussed the procedure including the risks, benefits and alternatives for the proposed anesthesia with the patient or authorized representative who has indicated his/her understanding and acceptance.       Plan Discussed with: CRNA  Anesthesia Plan Comments:         Anesthesia Quick Evaluation

## 2021-07-03 NOTE — Transfer of Care (Signed)
Immediate Anesthesia Transfer of Care Note  Patient: Deborah Harrison  Procedure(s) Performed: OPEN REDUCTION INTERNAL FIXATION (ORIF) WRIST FRACTURE (Right: Wrist)  Patient Location: PACU  Anesthesia Type:General  Level of Consciousness: awake, alert  and oriented  Airway & Oxygen Therapy: Patient Spontanous Breathing and Patient connected to face mask oxygen  Post-op Assessment: Report given to RN and Post -op Vital signs reviewed and stable  Post vital signs: Reviewed and stable  Last Vitals:  Vitals Value Taken Time  BP 126/68 07/03/21 1309  Temp    Pulse 74 07/03/21 1312  Resp 14 07/03/21 1312  SpO2 97 % 07/03/21 1312  Vitals shown include unvalidated device data.  Last Pain:  Vitals:   07/03/21 1214  TempSrc: Oral  PainSc: 0-No pain         Complications: No notable events documented.

## 2021-07-03 NOTE — Discharge Instructions (Addendum)
Keep arm elevated is much as possible the next 2 to 3 days until recheck Work your fingers all you can with splint on Loosen Ace wrap if fingers swell Pain medication as directed   May supplement with 600mg  of ibuprofen every 6 hours. Alternating with tylenol  AMBULATORY SURGERY  DISCHARGE INSTRUCTIONS   The drugs that you were given will stay in your system until tomorrow so for the next 24 hours you should not:  Drive an automobile Make any legal decisions Drink any alcoholic beverage   You may resume regular meals tomorrow.  Today it is better to start with liquids and gradually work up to solid foods.  You may eat anything you prefer, but it is better to start with liquids, then soup and crackers, and gradually work up to solid foods.   Please notify your doctor immediately if you have any unusual bleeding, trouble breathing, redness and pain at the surgery site, drainage, fever, or pain not relieved by medication.    Additional Instructions:    Please contact your physician with any problems or Same Day Surgery at [phone], Monday through Friday 6 am to 4 pm, or [place] at [facility] number at [phone].

## 2021-08-03 ENCOUNTER — Encounter: Payer: Self-pay | Admitting: Occupational Therapy

## 2021-08-03 ENCOUNTER — Ambulatory Visit: Payer: Medicare PPO | Attending: Student | Admitting: Occupational Therapy

## 2021-08-03 ENCOUNTER — Other Ambulatory Visit: Payer: Self-pay

## 2021-08-03 DIAGNOSIS — M25631 Stiffness of right wrist, not elsewhere classified: Secondary | ICD-10-CM | POA: Diagnosis present

## 2021-08-03 DIAGNOSIS — L905 Scar conditions and fibrosis of skin: Secondary | ICD-10-CM | POA: Diagnosis present

## 2021-08-03 DIAGNOSIS — M6281 Muscle weakness (generalized): Secondary | ICD-10-CM | POA: Diagnosis present

## 2021-08-03 NOTE — Therapy (Signed)
[place] [facility] ?[address] ?[place], [place], [address] ?Phone: [phone]   Fax:  [phone] ? ?Occupational Therapy Evaluation ? ?Patient Details  ?Name: Deborah Harrison ?MRN: [id number] ?Date of Birth: [birth year] ?Referring Provider (OT): Dr Rudene Christians ? ? ?Encounter Date: 08/03/2021 ? ? OT End of Session - 08/03/21 2034   ? ? Visit Number 1   ? Number of Visits 12   ? Date for OT Re-Evaluation 09/28/21   ? OT Start Time 1130   ? OT Stop Time 1217   ? OT Time Calculation (min) 47 min   ? Activity Tolerance Patient tolerated treatment well   ? Behavior During Therapy [facility] for tasks assessed/performed   ? ?  ?  ? ?  ? ? ?Past Medical History:  ?Diagnosis Date  ? Allergy   ? seasonal  ? Cancer [facility])   ? History of abnormal Pap smear   ? 10 years ago.  ? Pneumonia   ? ? ?Past Surgical History:  ?Procedure Laterality Date  ? COLPOSCOPY    ? abnormal pap x 34yrs ago.  ? ORIF WRIST FRACTURE Right 07/03/2021  ? Procedure: OPEN REDUCTION INTERNAL FIXATION (ORIF) WRIST FRACTURE;  Surgeon: Hessie Knows, MD;  Location: [facility] ORS;  Service: Orthopedics;  Laterality: Right;  ? ? ?There were no vitals filed for this visit. ? ? Subjective Assessment - 08/03/21 2026   ? ? Subjective  This is the first time ever that I broked something and needed surgery - doing okay - wearing the splint most all the time   ? Pertinent History Seen at Ortho on 07/16/21 for her first postop appointment following a open reduction and internal fixation of her right distal radius fracture. Surgery was performed by Dr. Rudene Christians on 07/03/2021. The patient is doing well. She is taking Tylenol sparingly for discomfort. Pain score today is a 2 out of 10. She states that after being taken out of the short arm splint at her skin check and into the Velcro wrist splint her pain has significantly improved. She denies any falls or trauma affecting the right wrist since her last evaluation. She denies any numbness or  tingling to the right hand or wrist at this time. She denies any signs of infection at home such as fevers or any drainage from the right wrist incision. She presents today for x-rays of the right wrist and suture removal. Stitches removed and sterri strips applied - referral to OT   ? Patient Stated Goals Want to be able to use my hand to crochet, knit and get back to yoga   ? Currently in Pain? No/denies   ? ?  ?  ? ?  ? ? ? ? [facility] OT Assessment - 08/03/21 0001   ? ?  ? Assessment  ? Medical Diagnosis R wrist fx ORIF /Colles fx   ? Referring Provider (OT) Dr Rudene Christians   ? Onset Date/Surgical Date 07/03/21   ? Hand Dominance Right   ? Next MD Visit in 2 wks   ?  ? Home  Environment  ? Lives With Spouse   ?  ? Prior Function  ? Vocation Retired   ? Leisure Crochet, knit and yoga, walk- house work   ?  ? AROM  ? Right Wrist Extension 40 Degrees   ? Right Wrist Flexion 33 Degrees   ? Right Wrist Radial Deviation 23 Degrees   ? Right Wrist Ulnar Deviation 22 Degrees   ?  Left Wrist Extension 85 Degrees   ? Left Wrist Flexion 90 Degrees   ? Left Wrist Radial Deviation 30 Degrees   ? Left Wrist Ulnar Deviation 40 Degrees   ? ?  ?  ? ?  ? ? ? ? ? ? ? ? ? ? ? OT Treatments/Exercises (OP) - 08/03/21 0001   ? ?  ? RUE Fluidotherapy  ? Number Minutes Fluidotherapy 8 Minutes   ? RUE Fluidotherapy Location Hand;Wrist   ? Comments prior to review of HEP   ? ?  ?  ? ?  ? ?Light massage to scar - sterri strips applied on middle part to be on safe side  ?AAROM for RD, UD of wrist  ?And flexion /ext open hand and loose fist  ?12 reps ?2-3 x day  ?After some heat  ?Splint on - but can take off when just sitting  ? ? ? ? ? ? ? ? OT Education - 08/03/21 2034   ? ? Education Details findings of  eval and HEP   ? Person(s) Educated Patient   ? Methods Explanation;Demonstration;Tactile cues;Handout;Verbal cues   ? Comprehension Verbal cues required;Returned demonstration;Verbalized understanding   ? ?  ?  ? ?  ? ? ? OT Short Term Goals -  08/03/21 2041   ? ?  ? OT SHORT TERM GOAL #1  ? Title Pt to be independent in HEP  to decrease scar tissue and increase wrist AROM   ? Baseline no knowledge of HEP -sterri strips removed today ,  wrist splint on most all the time -wrist ROM decrease   ? Time 3   ? Period Weeks   ? Status New   ? Target Date 08/24/21   ? ?  ?  ? ?  ? ? ? ? OT Long Term Goals - 08/03/21 2042   ? ?  ? OT LONG TERM GOAL #1  ? Title R wrist AROM increase to [facility] to turn doorknob, pull and push door open and wash back   ? Baseline R wrist ext 40 , flexion 33, RD 25, UD 22   ? Time 6   ? Period Weeks   ? Status New   ? Target Date 09/14/21   ?  ? OT LONG TERM GOAL #2  ? Title R wrist strength increase for pt to push and pull heavy door, carry more than 8 lbs and push up from chair without increase symptoms   ? Baseline No strength - AROM limited - 4 1/2 wks s/p   ? Time 8   ? Period Weeks   ? Status New   ? Target Date 09/28/21   ?  ? OT LONG TERM GOAL #3  ? Title R grip and prehension strength increase to more than 60 % compare to L hand to carry more than 8 lbs, carry plate and open packages with out increase symptoms   ? Baseline NT - 4 1/2 wks s/p - decrease AROM for wrist - wrist splint most of the time   ? Time 8   ? Period Weeks   ? Status New   ? Target Date 09/28/21   ? ?  ?  ? ?  ? ? ? ? ? ? ? ? Plan - 08/03/21 2036   ? ? Clinical Impression Statement Pt present 4 1/2 wks s/p ORIF radius fx (Colles fx) , pt in wrist splint most of the time - show digits flexion and opposition WNL ,  thumb PA and RA WNL. Sterri strips removed and had one area that was open in middle of scar after pt was ed on scar massage - sterri strip applied to keep on until next session. Pt show decrease wrist AROM in all planes -but sup /pro WNL - decrease strength but no pain except with ROM - pt limited in use of R dominant hand and wrist in ADL's and IADL's - pt can benefit from skilled OT services.   ? OT Occupational Profile and History Problem Focused  Assessment - Including review of records relating to presenting problem   ? Occupational performance deficits (Please refer to evaluation for details): ADL's;IADL's;Play;Leisure;Social Participation   ? Body Structure / Function / Physical Skills ADL;Strength;Decreased knowledge of precautions;Skin integrity;Flexibility;Scar mobility;IADL;ROM;Edema;Pain;UE functional use   ? Rehab Potential Good   ? Clinical Decision Making Limited treatment options, no task modification necessary   ? Comorbidities Affecting Occupational Performance: None   ? Modification or Assistance to Complete Evaluation  No modification of tasks or assist necessary to complete eval   ? OT Frequency --   1-2x wks  ? OT Duration 8 weeks   ? OT Treatment/Interventions Self-care/ADL training;Paraffin;Manual Therapy;Patient/family education;Passive range of motion;Therapeutic exercise;Scar mobilization;Fluidtherapy;Splinting   ? Consulted and Agree with Plan of Care Patient;Family member/caregiver   ? ?  ?  ? ?  ? ? ?Patient will benefit from skilled therapeutic intervention in order to improve the following deficits and impairments:   ?Body Structure / Function / Physical Skills: ADL, Strength, Decreased knowledge of precautions, Skin integrity, Flexibility, Scar mobility, IADL, ROM, Edema, Pain, UE functional use ?  ?  ? ? ?Visit Diagnosis: ?Scar condition and fibrosis of skin - Plan: Ot plan of care cert/re-cert ? ?Stiffness of right wrist, not elsewhere classified - Plan: Ot plan of care cert/re-cert ? ?Muscle weakness (generalized) - Plan: Ot plan of care cert/re-cert ? ? ? ?Problem List ?There are no problems to display for this patient. ? ? ?Rosalyn Gess, OTR/L,CLT ?08/03/2021, 8:47 PM ? ?[place] ?[facility] ?[address] ?[place], [place], [address] ?Phone: [phone]   Fax:  [phone] ? ?Name: Deborah Harrison ?MRN: [id number] ?Date of Birth: 1945-12-31 ? ?

## 2021-08-07 ENCOUNTER — Ambulatory Visit: Payer: Medicare PPO | Admitting: Occupational Therapy

## 2021-08-07 ENCOUNTER — Other Ambulatory Visit: Payer: Self-pay

## 2021-08-07 DIAGNOSIS — L905 Scar conditions and fibrosis of skin: Secondary | ICD-10-CM

## 2021-08-07 DIAGNOSIS — M6281 Muscle weakness (generalized): Secondary | ICD-10-CM

## 2021-08-07 DIAGNOSIS — M25631 Stiffness of right wrist, not elsewhere classified: Secondary | ICD-10-CM

## 2021-08-07 NOTE — Therapy (Signed)
[place] ?[place] [facility] ?[address] ?[place], [place], [address] ?Phone: [phone]   Fax:  [phone] ? ?Occupational Therapy Treatment ? ?Patient Details  ?Name: Deborah Harrison ?MRN: [id number] ?Date of Birth: [birth year] ?Referring Provider (OT): Dr Rudene Christians ? ? ?Encounter Date: 08/07/2021 ? ? OT End of Session - 08/07/21 1201   ? ? Visit Number 2   ? Number of Visits 12   ? Date for OT Re-Evaluation 09/28/21   ? OT Start Time 1034   ? OT Stop Time 1114   ? OT Time Calculation (min) 40 min   ? Activity Tolerance Patient tolerated treatment well   ? Behavior During Therapy [facility] for tasks assessed/performed   ? ?  ?  ? ?  ? ? ?Past Medical History:  ?Diagnosis Date  ? Allergy   ? seasonal  ? Cancer [facility])   ? History of abnormal Pap smear   ? 10 years ago.  ? Pneumonia   ? ? ?Past Surgical History:  ?Procedure Laterality Date  ? COLPOSCOPY    ? abnormal pap x 39yr ago.  ? ORIF WRIST FRACTURE Right 07/03/2021  ? Procedure: OPEN REDUCTION INTERNAL FIXATION (ORIF) WRIST FRACTURE;  Surgeon: MHessie Knows MD;  Location: [facility] ORS;  Service: Orthopedics;  Laterality: Right;  ? ? ?There were no vitals filed for this visit. ? ? Subjective Assessment - 08/07/21 1200   ? ? Subjective  The middle finger sometimes gets stiff or some pain-otherwise doing okay   ? Pertinent History Seen at Ortho on 07/16/21 for her first postop appointment following a open reduction and internal fixation of her right distal radius fracture. Surgery was performed by Dr. MRudene Christianson 07/03/2021. The patient is doing well. She is taking Tylenol sparingly for discomfort. Pain score today is a 2 out of 10. She states that after being taken out of the short arm splint at her skin check and into the Velcro wrist splint her pain has significantly improved. She denies any falls or trauma affecting the right wrist since her last evaluation. She denies any numbness or tingling to the right hand or wrist at this time. She  denies any signs of infection at home such as fevers or any drainage from the right wrist incision. She presents today for x-rays of the right wrist and suture removal. Stitches removed and sterri strips applied - referral to OT   ? Patient Stated Goals Want to be able to use my hand to crochet, knit and get back to yoga   ? Currently in Pain? No/denies   ? ?  ?  ? ?  ? ? ? ? ? [facility] OT Assessment - 08/07/21 0001   ? ?  ? AROM  ? Right Wrist Extension 50 Degrees   ? Right Wrist Flexion 42 Degrees   ? Right Wrist Radial Deviation 30 Degrees   ? Right Wrist Ulnar Deviation 25 Degrees   ? ?  ?  ? ?  ? ? ? ? ? ? ? ? ? ? ? OT Treatments/Exercises (OP) - 08/07/21 0001   ? ?  ? Moist Heat Therapy  ? Number Minutes Moist Heat 8 Minutes   ? Moist Heat Location Hand;Wrist   prior to ROM  ? ?  ?  ? ?  ? ? ?  ?Light massage to scar around and distal/proximal  - area in middle let it air some to clear and close the scab ?AAROM for RD, UD,  flexion, ext of wrist edge of table  ?Followed by AROM for wrist flexion /ext open hand and loose fist , RD, UD  ?Add tendon  glides  ?And AROM for sup/pro  ?12 reps ?2-3 x day  ?After some heat  ?Splint on - but can take off when just sitting  ?  ? ? ? ? ? ? OT Education - 08/07/21 1201   ? ? Education Details progress and changes to HEP   ? Person(s) Educated Patient   ? Comprehension Verbal cues required;Returned demonstration;Verbalized understanding   ? ?  ?  ? ?  ? ? ? OT Short Term Goals - 08/03/21 2041   ? ?  ? OT SHORT TERM GOAL #1  ? Title Pt to be independent in HEP  to decrease scar tissue and increase wrist AROM   ? Baseline no knowledge of HEP -sterri strips removed today ,  wrist splint on most all the time -wrist ROM decrease   ? Time 3   ? Period Weeks   ? Status New   ? Target Date 08/24/21   ? ?  ?  ? ?  ? ? ? ? OT Long Term Goals - 08/03/21 2042   ? ?  ? OT LONG TERM GOAL #1  ? Title R wrist AROM increase to [facility] to turn doorknob, pull and push door open and wash back   ?  Baseline R wrist ext 40 , flexion 33, RD 25, UD 22   ? Time 6   ? Period Weeks   ? Status New   ? Target Date 09/14/21   ?  ? OT LONG TERM GOAL #2  ? Title R wrist strength increase for pt to push and pull heavy door, carry more than 8 lbs and push up from chair without increase symptoms   ? Baseline No strength - AROM limited - 4 1/2 wks s/p   ? Time 8   ? Period Weeks   ? Status New   ? Target Date 09/28/21   ?  ? OT LONG TERM GOAL #3  ? Title R grip and prehension strength increase to more than 60 % compare to L hand to carry more than 8 lbs, carry plate and open packages with out increase symptoms   ? Baseline NT - 4 1/2 wks s/p - decrease AROM for wrist - wrist splint most of the time   ? Time 8   ? Period Weeks   ? Status New   ? Target Date 09/28/21   ? ?  ?  ? ?  ? ? ? ? ? ? ? ? Plan - 08/07/21 1202   ? ? Clinical Impression Statement Pt present 4 1/2 wks s/p ORIF radius fx (Colles fx) , pt in wrist splint most of the time - show digits flexion and opposition WNL , thumb PA and RA WNL.  R supination and pronation WNL - had one area that was open in middle of scar  - but pt to air little and get it dry. Pt making progress in wrist AROM - add AAROM for flexion, ext, RD, UD - tolerating well - decrease  wrist ,AROM strength but no pain  at rest except with ROM slight pull- pt limited in use of R dominant hand and wrist in ADL's and IADL's - pt can benefit from skilled OT services.   ? OT Occupational Profile and History Problem Focused Assessment - Including review of records relating to  presenting problem   ? Occupational performance deficits (Please refer to evaluation for details): ADL's;IADL's;Play;Leisure;Social Participation   ? Body Structure / Function / Physical Skills ADL;Strength;Decreased knowledge of precautions;Skin integrity;Flexibility;Scar mobility;IADL;ROM;Edema;Pain;UE functional use   ? Rehab Potential Good   ? Clinical Decision Making Limited treatment options, no task modification  necessary   ? Comorbidities Affecting Occupational Performance: None   ? Modification or Assistance to Complete Evaluation  No modification of tasks or assist necessary to complete eval   ? OT Frequency 1x / week   1-2 x wk  ? OT Duration 8 weeks   ? OT Treatment/Interventions Self-care/ADL training;Paraffin;Manual Therapy;Patient/family education;Passive range of motion;Therapeutic exercise;Scar mobilization;Fluidtherapy;Splinting   ? Consulted and Agree with Plan of Care Patient;Family member/caregiver   ? ?  ?  ? ?  ? ? ?Patient will benefit from skilled therapeutic intervention in order to improve the following deficits and impairments:   ?Body Structure / Function / Physical Skills: ADL, Strength, Decreased knowledge of precautions, Skin integrity, Flexibility, Scar mobility, IADL, ROM, Edema, Pain, UE functional use ?  ?  ? ? ?Visit Diagnosis: ?Scar condition and fibrosis of skin ? ?Stiffness of right wrist, not elsewhere classified ? ?Muscle weakness (generalized) ? ? ? ?Problem List ?There are no problems to display for this patient. ? ? ?Rosalyn Gess, OTR/L,CLT ?08/07/2021, 12:10 PM ? ?[place] ?[facility] ?[address] ?[place], [place], [address] ?Phone: [phone]   Fax:  [phone] ? ?Name: Hanaan Gancarz ?MRN: [id number] ?Date of Birth: [birth year] ? ?

## 2021-08-14 ENCOUNTER — Ambulatory Visit: Payer: Medicare PPO | Admitting: Occupational Therapy

## 2021-08-14 ENCOUNTER — Other Ambulatory Visit: Payer: Self-pay

## 2021-08-14 DIAGNOSIS — L905 Scar conditions and fibrosis of skin: Secondary | ICD-10-CM | POA: Diagnosis not present

## 2021-08-14 DIAGNOSIS — M25631 Stiffness of right wrist, not elsewhere classified: Secondary | ICD-10-CM

## 2021-08-14 DIAGNOSIS — M6281 Muscle weakness (generalized): Secondary | ICD-10-CM

## 2021-08-14 NOTE — Therapy (Signed)
[place] [facility] ?[address] ?[place], [place], [address] ?Phone: [phone]   Fax:  [phone] ? ?Occupational Therapy Treatment ? ?Patient Details  ?Name: Deborah Harrison ?MRN: [id number] ?Date of Birth: 10-16-1945 ?Referring Provider (OT): Dr Rudene Christians ? ? ?Encounter Date: 08/14/2021 ? ? OT End of Session - 08/14/21 1033   ? ? Visit Number 3   ? Number of Visits 12   ? Date for OT Re-Evaluation 09/28/21   ? OT Start Time 1032   ? OT Stop Time 1116   ? OT Time Calculation (min) 44 min   ? Activity Tolerance Patient tolerated treatment well   ? Behavior During Therapy [facility] for tasks assessed/performed   ? ?  ?  ? ?  ? ? ?Past Medical History:  ?Diagnosis Date  ? Allergy   ? seasonal  ? Cancer [facility])   ? History of abnormal Pap smear   ? 10 years ago.  ? Pneumonia   ? ? ?Past Surgical History:  ?Procedure Laterality Date  ? COLPOSCOPY    ? abnormal pap x 32yr ago.  ? ORIF WRIST FRACTURE Right 07/03/2021  ? Procedure: OPEN REDUCTION INTERNAL FIXATION (ORIF) WRIST FRACTURE;  Surgeon: MHessie Knows MD;  Location: [facility] ORS;  Service: Orthopedics;  Laterality: Right;  ? ? ?There were no vitals filed for this visit. ? ? Subjective Assessment - 08/14/21 1032   ? ? Subjective  Middle finger clicking  and painfull in the joint - and tender in palm - but other wise doing okay -seen DR MRudene Christians- healing well my fracture   ? Pertinent History Seen at Ortho on 07/16/21 for her first postop appointment following a open reduction and internal fixation of her right distal radius fracture. Surgery was performed by Dr. MRudene Christianson 07/03/2021. The patient is doing well. She is taking Tylenol sparingly for discomfort. Pain score today is a 2 out of 10. She states that after being taken out of the short arm splint at her skin check and into the Velcro wrist splint her pain has significantly improved. She denies any falls or trauma affecting the right wrist since her last evaluation. She denies  any numbness or tingling to the right hand or wrist at this time. She denies any signs of infection at home such as fevers or any drainage from the right wrist incision. She presents today for x-rays of the right wrist and suture removal. Stitches removed and sterri strips applied - referral to OT   ? Patient Stated Goals Want to be able to use my hand to crochet, knit and get back to yoga   ? Currently in Pain? Yes   ? Pain Score 2    ? Pain Location Finger (Comment which one)   ? Pain Orientation Right   ? Pain Descriptors / Indicators Tender;Tightness   ? Pain Type Surgical pain;Acute pain   ? Pain Frequency Intermittent   ? ?  ?  ? ?  ? ? ? ? ? [facility] OT Assessment - 08/14/21 0001   ? ?  ? AROM  ? Right Wrist Extension 50 Degrees   ? Right Wrist Flexion 52 Degrees   ? Right Wrist Radial Deviation 30 Degrees   ? Right Wrist Ulnar Deviation 25 Degrees   ? ?  ?  ? ?  ? ?in session after heat increase 10 degrees in flexion and extnetion  ?Have some tenderness over 3rd A1pulley - and pain in PIP ?  Pt ed on joint protection , avoid tight grip , enlarge grips or use palms ? Hold off on knitting this week ? Scar still adhere and limited motion  ? ? ? ? ? ? ? ? ? OT Treatments/Exercises (OP) - 08/14/21 0001   ? ?  ? Moist Heat Therapy  ? Number Minutes Moist Heat 6 Minutes   ? Moist Heat Location Hand;Wrist   wrist flexion stretch 2 x 2 min  ? ?  ?  ? ?  ? ?Light massage to scar distally   - area in middle l and proximal some scabs still  ?AAROM for RD, UD, flexion, ext of wrist edge of table  ?Add prayer stretch and prolonged flexion stretch - doing well in session  ?Upgrade to 1 lbs for wrist in all planes- needed mod A -and v/c  ?12 reps ?2 x day after stretches   ?Cont tendon glides ?12 reps ?2 x day  ?After some heat  ? ? ? ? ? ? ? ? OT Education - 08/14/21 1033   ? ? Education Details progress and changes to HEP   ? Person(s) Educated Patient   ? Methods Explanation;Demonstration;Tactile cues;Handout;Verbal cues   ?  Comprehension Verbal cues required;Returned demonstration;Verbalized understanding   ? ?  ?  ? ?  ? ? ? OT Short Term Goals - 08/03/21 2041   ? ?  ? OT SHORT TERM GOAL #1  ? Title Pt to be independent in HEP  to decrease scar tissue and increase wrist AROM   ? Baseline no knowledge of HEP -sterri strips removed today ,  wrist splint on most all the time -wrist ROM decrease   ? Time 3   ? Period Weeks   ? Status New   ? Target Date 08/24/21   ? ?  ?  ? ?  ? ? ? ? OT Long Term Goals - 08/03/21 2042   ? ?  ? OT LONG TERM GOAL #1  ? Title R wrist AROM increase to [facility] to turn doorknob, pull and push door open and wash back   ? Baseline R wrist ext 40 , flexion 33, RD 25, UD 22   ? Time 6   ? Period Weeks   ? Status New   ? Target Date 09/14/21   ?  ? OT LONG TERM GOAL #2  ? Title R wrist strength increase for pt to push and pull heavy door, carry more than 8 lbs and push up from chair without increase symptoms   ? Baseline No strength - AROM limited - 4 1/2 wks s/p   ? Time 8   ? Period Weeks   ? Status New   ? Target Date 09/28/21   ?  ? OT LONG TERM GOAL #3  ? Title R grip and prehension strength increase to more than 60 % compare to L hand to carry more than 8 lbs, carry plate and open packages with out increase symptoms   ? Baseline NT - 4 1/2 wks s/p - decrease AROM for wrist - wrist splint most of the time   ? Time 8   ? Period Weeks   ? Status New   ? Target Date 09/28/21   ? ?  ?  ? ?  ? ? ? ? ? ? ? ? Plan - 08/14/21 1033   ? ? Clinical Impression Statement Pt present 4 1/2 wks s/p ORIF radius fx (Colles fx) , pt in wrist  splint most of the time - show digits flexion and opposition WNL , thumb PA and RA WNL.  R supination and pronation WNL - Still has some scabs on volar scar- scar mobs on distal scar and kinesiotape. Pt making progress in wrist AROM in flexion more than ext- but review again  AAROM for flexion, ext, RD, UD in session - progressed after heat and AAROM /PROM 10 degrees in flexion, extention -add 1  lbs weight to HEP  - tolerating well - decrease  wrist ,AROM strength but  pain in 3rd digit- appear to have some trigger finger issues but not triggering or locking-- pt limited in use of R dominant hand and wrist in ADL's and IADL's - pt can benefit from skilled OT services.   ? OT Occupational Profile and History Problem Focused Assessment - Including review of records relating to presenting problem   ? Occupational performance deficits (Please refer to evaluation for details): ADL's;IADL's;Play;Leisure;Social Participation   ? Body Structure / Function / Physical Skills ADL;Strength;Decreased knowledge of precautions;Skin integrity;Flexibility;Scar mobility;IADL;ROM;Edema;Pain;UE functional use   ? Rehab Potential Good   ? Clinical Decision Making Limited treatment options, no task modification necessary   ? Comorbidities Affecting Occupational Performance: None   ? Modification or Assistance to Complete Evaluation  No modification of tasks or assist necessary to complete eval   ? OT Frequency --   1-2 x wk  ? OT Duration 8 weeks   ? OT Treatment/Interventions Self-care/ADL training;Paraffin;Manual Therapy;Patient/family education;Passive range of motion;Therapeutic exercise;Scar mobilization;Fluidtherapy;Splinting   ? Consulted and Agree with Plan of Care Patient;Family member/caregiver   ? ?  ?  ? ?  ? ? ?Patient will benefit from skilled therapeutic intervention in order to improve the following deficits and impairments:   ?Body Structure / Function / Physical Skills: ADL, Strength, Decreased knowledge of precautions, Skin integrity, Flexibility, Scar mobility, IADL, ROM, Edema, Pain, UE functional use ?  ?  ? ? ?Visit Diagnosis: ?Scar condition and fibrosis of skin ? ?Stiffness of right wrist, not elsewhere classified ? ?Muscle weakness (generalized) ? ? ? ?Problem List ?There are no problems to display for this patient. ? ? ?Rosalyn Gess, OTR/L,CLT ?08/14/2021, 12:03 PM ? ?[place] [facility] ?[address] ?[place], [place], [address] ?Phone: [phone]   Fax:  [phone] ? ?Name: Deborah Harrison ?MRN: [id number] ?Date of Birth: 06-Aug-1945 ? ?

## 2021-08-17 ENCOUNTER — Other Ambulatory Visit: Payer: Self-pay

## 2021-08-17 ENCOUNTER — Ambulatory Visit: Payer: Medicare PPO | Admitting: Occupational Therapy

## 2021-08-17 DIAGNOSIS — M25631 Stiffness of right wrist, not elsewhere classified: Secondary | ICD-10-CM

## 2021-08-17 DIAGNOSIS — M6281 Muscle weakness (generalized): Secondary | ICD-10-CM

## 2021-08-17 DIAGNOSIS — L905 Scar conditions and fibrosis of skin: Secondary | ICD-10-CM

## 2021-08-17 NOTE — Therapy (Signed)
[facility] ?[address] ?[place], [place], [address] ?Phone: [phone]   Fax:  [phone] ? ?Occupational Therapy Treatment ? ?Patient Details  ?Name: Deborah Harrison ?MRN: [id number] ?Date of Birth: 1946/06/02 ?Referring Provider (OT): Dr Rudene Christians ? ? ?Encounter Date: 08/17/2021 ? ? OT End of Session - 08/17/21 1225   ? ? Visit Number 4   ? Number of Visits 12   ? Date for OT Re-Evaluation 09/28/21   ? OT Start Time 1001   ? OT Stop Time 1048   ? OT Time Calculation (min) 47 min   ? Activity Tolerance Patient tolerated treatment well   ? Behavior During Therapy [facility] for tasks assessed/performed   ? ?  ?  ? ?  ? ? ?Past Medical History:  ?Diagnosis Date  ? Allergy   ? seasonal  ? Cancer [facility])   ? History of abnormal Pap smear   ? 10 years ago.  ? Pneumonia   ? ? ?Past Surgical History:  ?Procedure Laterality Date  ? COLPOSCOPY    ? abnormal pap x 68yr ago.  ? ORIF WRIST FRACTURE Right 07/03/2021  ? Procedure: OPEN REDUCTION INTERNAL FIXATION (ORIF) WRIST FRACTURE;  Surgeon: MHessie Knows MD;  Location: [facility] ORS;  Service: Orthopedics;  Laterality: Right;  ? ? ?There were no vitals filed for this visit. ? ? Subjective Assessment - 08/17/21 1224   ? ? Subjective  My hand and wrist just sore - last night it bothered me more- I am not wearing my splint anymore-  using my hand more   ? Pertinent History Seen at Ortho on 07/16/21 for her first postop appointment following a open reduction and internal fixation of her right distal radius fracture. Surgery was performed by Dr. MRudene Christianson 07/03/2021. The patient is doing well. She is taking Tylenol sparingly for discomfort. Pain score today is a 2 out of 10. She states that after being taken out of the short arm splint at her skin check and into the Velcro wrist splint her pain has significantly improved. She denies any falls or trauma affecting the right wrist since her last evaluation. She denies any numbness or  tingling to the right hand or wrist at this time. She denies any signs of infection at home such as fevers or any drainage from the right wrist incision. She presents today for x-rays of the right wrist and suture removal. Stitches removed and sterri strips applied - referral to OT   ? Patient Stated Goals Want to be able to use my hand to crochet, knit and get back to yoga   ? Currently in Pain? Yes   ? Pain Score 2    ? Pain Location Hand   ? Pain Orientation Right   ? Pain Descriptors / Indicators Tightness;Aching   ? Pain Type Acute pain;Surgical pain   ? Pain Onset More than a month ago   ? Pain Frequency Intermittent   ? ?  ?  ? ?  ? ? ? ? ? [facility] OT Assessment - 08/17/21 0001   ? ?  ? Strength  ? Right Hand Lateral Pinch 9 lbs   ? Right Hand 3 Point Pinch 7 lbs   ? Left Hand Grip (lbs) 24   ? Left Hand Lateral Pinch 13 lbs   ? Left Hand 3 Point Pinch 12 lbs   ? ?  ?  ? ?  ? ? ? ?Cont to have some tenderness  over 3rd A1pulley - and pain in PIP but better than last time ? Reinforce again joint protection , avoid tight grip , enlarge grips or use palms ? Hold off on knitting this week ? Scar still adhere and limited motion in flexion more than extention ?  ?  ?  ?  ?  ?  ?  ?  ? ? ? ? ? ? ? OT Treatments/Exercises (OP) - 08/17/21 0001   ? ?  ? RUE Fluidotherapy  ? Number Minutes Fluidotherapy 8 Minutes   ? RUE Fluidotherapy Location Hand;Wrist   ? Comments prior to AROM and review of HEP   ? ?  ?  ? ?  ? ? ?Scar massage done - improving - change kinesiotape to parallel 2  30% pull and across 2 100 % pull ? Scar massage done over cica scar pad using mini massager and manual  ?Graston tool nr 2 over volar wrist and palm - sweeping - decrease tightness  ?  ?AAROM for RD, UD, flexion, ext of wrist edge of table -patient to do double [place] flexion ?Add prayer stretch and prolonged flexion stretch - ?1 lbs for wrist in all planes- needed mod A -and v/c  ?12 reps 2 sets ?2 x day after stretches   ?Cont tendon glides ?12  reps ?2 x day  ?After some heat  ?  ? ? ? ? ? ? OT Education - 08/17/21 1225   ? ? Education Details progress and changes to HEP   ? Person(s) Educated Patient   ? Methods Explanation;Demonstration;Tactile cues;Handout;Verbal cues   ? Comprehension Verbal cues required;Returned demonstration;Verbalized understanding   ? ?  ?  ? ?  ? ? ? OT Short Term Goals - 08/03/21 2041   ? ?  ? OT SHORT TERM GOAL #1  ? Title Pt to be independent in HEP  to decrease scar tissue and increase wrist AROM   ? Baseline no knowledge of HEP -sterri strips removed today ,  wrist splint on most all the time -wrist ROM decrease   ? Time 3   ? Period Weeks   ? Status New   ? Target Date 08/24/21   ? ?  ?  ? ?  ? ? ? ? OT Long Term Goals - 08/03/21 2042   ? ?  ? OT LONG TERM GOAL #1  ? Title R wrist AROM increase to [facility] to turn doorknob, pull and push door open and wash back   ? Baseline R wrist ext 40 , flexion 33, RD 25, UD 22   ? Time 6   ? Period Weeks   ? Status New   ? Target Date 09/14/21   ?  ? OT LONG TERM GOAL #2  ? Title R wrist strength increase for pt to push and pull heavy door, carry more than 8 lbs and push up from chair without increase symptoms   ? Baseline No strength - AROM limited - 4 1/2 wks s/p   ? Time 8   ? Period Weeks   ? Status New   ? Target Date 09/28/21   ?  ? OT LONG TERM GOAL #3  ? Title R grip and prehension strength increase to more than 60 % compare to L hand to carry more than 8 lbs, carry plate and open packages with out increase symptoms   ? Baseline NT - 4 1/2 wks s/p - decrease AROM for wrist - wrist splint most of the  time   ? Time 8   ? Period Weeks   ? Status New   ? Target Date 09/28/21   ? ?  ?  ? ?  ? ? ? ? ? ? ? ? Plan - 08/17/21 1226   ? ? Clinical Impression Statement Pt present 6 1/2 wks s/p ORIF radius fx (Colles fx) -patient reports not using her wrist splint at all anymore, increase using her hand and ADLs around the house.  Do have increased pain at the end of the day mostly in the palm.   Fisting WNL, thumb AROM WNL - Scar tissue improved and pt ed again on scar mobs and use of kinesiotape.  Pt making progress in wrist AROM extention this date more than flexion. 1 lbs weight to HEP increase to 2nd set. Cont with AAROM and PROM for wrist - fit with Benik neoprene to decrease pain and provide pt with support with strengthening  and functional use . 3rd digit appear to have some trigger finger issues but not triggering or locking-- tenderness less today - pt limited in use of R dominant hand and wrist in ADL's and IADL's - pt can benefit from skilled OT services.   ? OT Occupational Profile and History Problem Focused Assessment - Including review of records relating to presenting problem   ? Occupational performance deficits (Please refer to evaluation for details): ADL's;IADL's;Play;Leisure;Social Participation   ? Body Structure / Function / Physical Skills ADL;Strength;Decreased knowledge of precautions;Skin integrity;Flexibility;Scar mobility;IADL;ROM;Edema;Pain;UE functional use   ? Rehab Potential Good   ? Clinical Decision Making Limited treatment options, no task modification necessary   ? Comorbidities Affecting Occupational Performance: None   ? Modification or Assistance to Complete Evaluation  No modification of tasks or assist necessary to complete eval   ? OT Frequency --   1-2 x wks  ? OT Duration 8 weeks   ? OT Treatment/Interventions Self-care/ADL training;Paraffin;Manual Therapy;Patient/family education;Passive range of motion;Therapeutic exercise;Scar mobilization;Fluidtherapy;Splinting   ? Consulted and Agree with Plan of Care Patient;Family member/caregiver   ? ?  ?  ? ?  ? ? ?Patient will benefit from skilled therapeutic intervention in order to improve the following deficits and impairments:   ?Body Structure / Function / Physical Skills: ADL, Strength, Decreased knowledge of precautions, Skin integrity, Flexibility, Scar mobility, IADL, ROM, Edema, Pain, UE functional use ?  ?   ? ? ?Visit Diagnosis: ?Scar condition and fibrosis of skin ? ?Stiffness of right wrist, not elsewhere classified ? ?Muscle weakness (generalized) ? ? ? ?Problem List ?There are no problems to display for t

## 2021-08-21 ENCOUNTER — Other Ambulatory Visit: Payer: Self-pay

## 2021-08-21 ENCOUNTER — Ambulatory Visit: Payer: Medicare PPO | Admitting: Occupational Therapy

## 2021-08-21 DIAGNOSIS — M25631 Stiffness of right wrist, not elsewhere classified: Secondary | ICD-10-CM

## 2021-08-21 DIAGNOSIS — L905 Scar conditions and fibrosis of skin: Secondary | ICD-10-CM | POA: Diagnosis not present

## 2021-08-21 DIAGNOSIS — M6281 Muscle weakness (generalized): Secondary | ICD-10-CM

## 2021-08-21 NOTE — Therapy (Signed)
[facility] ?[facility] ?[address] ?[place], [place], [address] ?Phone: [phone]   Fax:  [phone] ? ?Occupational Therapy Treatment ? ?Patient Details  ?Name: Deborah Harrison ?MRN: [id number] ?Date of Birth: May 15, 1946 ?Referring Provider (OT): Dr Rudene Christians ? ? ?Encounter Date: 08/21/2021 ? ? OT End of Session - 08/21/21 1036   ? ? Visit Number 5   ? Number of Visits 12   ? Date for OT Re-Evaluation 09/28/21   ? OT Start Time 1036   ? OT Stop Time 1119   ? OT Time Calculation (min) 43 min   ? Activity Tolerance Patient tolerated treatment well   ? Behavior During Therapy [facility] for tasks assessed/performed   ? ?  ?  ? ?  ? ? ?Past Medical History:  ?Diagnosis Date  ? Allergy   ? seasonal  ? Cancer [facility])   ? History of abnormal Pap smear   ? 10 years ago.  ? Pneumonia   ? ? ?Past Surgical History:  ?Procedure Laterality Date  ? COLPOSCOPY    ? abnormal pap x 70yr ago.  ? ORIF WRIST FRACTURE Right 07/03/2021  ? Procedure: OPEN REDUCTION INTERNAL FIXATION (ORIF) WRIST FRACTURE;  Surgeon: MHessie Knows MD;  Location: [facility] ORS;  Service: Orthopedics;  Laterality: Right;  ? ? ?There were no vitals filed for this visit. ? ? Subjective Assessment - 08/21/21 1036   ? ? Subjective  Increase pain last night and thru the night - wore the soft splint less  than 25 % - started the weight 2nd set Sunday- pain was over the back of my pinkie and ring finger   ? Pertinent History Seen at Ortho on 07/16/21 for her first postop appointment following a open reduction and internal fixation of her right distal radius fracture. Surgery was performed by Dr. MRudene Christianson 07/03/2021. The patient is doing well. She is taking Tylenol sparingly for discomfort. Pain score today is a 2 out of 10. She states that after being taken out of the short arm splint at her skin check and into the Velcro wrist splint her pain has significantly improved. She denies any falls or trauma affecting the right wrist since  her last evaluation. She denies any numbness or tingling to the right hand or wrist at this time. She denies any signs of infection at home such as fevers or any drainage from the right wrist incision. She presents today for x-rays of the right wrist and suture removal. Stitches removed and sterri strips applied - referral to OT   ? Patient Stated Goals Want to be able to use my hand to crochet, knit and get back to yoga   ? Currently in Pain? Yes   ? Pain Score 3    ? Pain Location Hand   ? Pain Orientation Right   ? Pain Descriptors / Indicators Aching   ? Pain Type Surgical pain;Acute pain   ? Pain Onset More than a month ago   ? Pain Frequency Intermittent   ? ?  ?  ? ?  ? ? ? ? ? [facility] OT Assessment - 08/21/21 0001   ? ?  ? AROM  ? Right Wrist Extension 60 Degrees   ? Right Wrist Flexion 62 Degrees   ? ?  ?  ? ?  ? ? ? ? ? ? ? ? ? ? ? OT Treatments/Exercises (OP) - 08/21/21 0001   ? ?  ? RUE Fluidotherapy  ?  Number Minutes Fluidotherapy 8 Minutes   ? RUE Fluidotherapy Location Hand;Wrist   ? Comments Prior to AROM   ? ?  ?  ? ?  ? ?Scar massage done - improving - change kinesiotape to parallel 2  30% pull and across 3 100 % pull ?Husband and wife report that they have done it twice since of seeing them last time ? Scar massage done over cica scar pad using mini massager and manual  ?  ?Reviewed again [facility] for RD, UD, flexion, ext of wrist edge of table -patient to do double time flexion ?Great progress in wrist flexion extension ?Add prayer stretch and prolonged flexion stretch - ?Change her 1 pound weight to smaller circumference to decrease pain over dorsal fourth and fifth metacarpal and ulnar wrist  ?1 lbs for wrist in all planes- needed mod A -and v/c  ?12 reps decreased to 1 set ?Patient to keep pain-free endrange supination pronation as well as use blue Benik neoprene as needed ?For wrist extension to a place and hold ?Patient was pain-free with changes to HEP ?2 x day after stretches   ?Cont tendon  glides ?12 reps ?2 x day  ?After some heat  ? ? ? ? ? ? ? OT Education - 08/21/21 1036   ? ? Education Details progress and changes to HEP   ? Person(s) Educated Patient;Spouse   ? Methods Explanation;Demonstration;Tactile cues;Handout;Verbal cues   ? Comprehension Verbal cues required;Returned demonstration;Verbalized understanding   ? ?  ?  ? ?  ? ? ? OT Short Term Goals - 08/03/21 2041   ? ?  ? OT SHORT TERM GOAL #1  ? Title Pt to be independent in HEP  to decrease scar tissue and increase wrist AROM   ? Baseline no knowledge of HEP -sterri strips removed today ,  wrist splint on most all the time -wrist ROM decrease   ? Time 3   ? Period Weeks   ? Status New   ? Target Date 08/24/21   ? ?  ?  ? ?  ? ? ? ? OT Long Term Goals - 08/03/21 2042   ? ?  ? OT LONG TERM GOAL #1  ? Title R wrist AROM increase to [facility] to turn doorknob, pull and push door open and wash back   ? Baseline R wrist ext 40 , flexion 33, RD 25, UD 22   ? Time 6   ? Period Weeks   ? Status New   ? Target Date 09/14/21   ?  ? OT LONG TERM GOAL #2  ? Title R wrist strength increase for pt to push and pull heavy door, carry more than 8 lbs and push up from chair without increase symptoms   ? Baseline No strength - AROM limited - 4 1/2 wks s/p   ? Time 8   ? Period Weeks   ? Status New   ? Target Date 09/28/21   ?  ? OT LONG TERM GOAL #3  ? Title R grip and prehension strength increase to more than 60 % compare to L hand to carry more than 8 lbs, carry plate and open packages with out increase symptoms   ? Baseline NT - 4 1/2 wks s/p - decrease AROM for wrist - wrist splint most of the time   ? Time 8   ? Period Weeks   ? Status New   ? Target Date 09/28/21   ? ?  ?  ? ?  ? ? ? ? ? ? ? ?  Plan - 08/21/21 1037   ? ? Clinical Impression Statement Patient presents 7 weeks postop ORIF radius fracture-Colles' fracture patient report increased pain last night over the dorsal fourth and fifth metacarpals and ulnar wrist.  Had pain with supination and pronation  as well as wrist extension.  Upon assessment.  Patient weight circumference is too big straining with wrist extension and digits extension.  Change patient to smaller weight circumference and placed on hold for wrist extension.  Patient stay in the pain-free range for supination pronation.  Had less pain in clinic today patient to continue to focus on scar massage to decrease scar adhesion.  Did show increase wrist flexion extension by 8 to 10 degrees patient continues to benefit from skilled OT services to decrease scar tissue, increased range of motion, increase strength to return to prior level of function   ? OT Occupational Profile and History Problem Focused Assessment - Including review of records relating to presenting problem   ? Occupational performance deficits (Please refer to evaluation for details): ADL's;IADL's;Play;Leisure;Social Participation   ? Body Structure / Function / Physical Skills ADL;Strength;Decreased knowledge of precautions;Skin integrity;Flexibility;Scar mobility;IADL;ROM;Edema;Pain;UE functional use   ? Rehab Potential Good   ? Clinical Decision Making Limited treatment options, no task modification necessary   ? Comorbidities Affecting Occupational Performance: None   ? Modification or Assistance to Complete Evaluation  No modification of tasks or assist necessary to complete eval   ? OT Frequency --   1-2 x wk  ? OT Duration 8 weeks   ? OT Treatment/Interventions Self-care/ADL training;Paraffin;Manual Therapy;Patient/family education;Passive range of motion;Therapeutic exercise;Scar mobilization;Fluidtherapy;Splinting   ? Consulted and Agree with Plan of Care Patient;Family member/caregiver   ? ?  ?  ? ?  ? ? ?Patient will benefit from skilled therapeutic intervention in order to improve the following deficits and impairments:   ?Body Structure / Function / Physical Skills: ADL, Strength, Decreased knowledge of precautions, Skin integrity, Flexibility, Scar mobility, IADL, ROM,  Edema, Pain, UE functional use ?  ?  ? ? ?Visit Diagnosis: ?Scar condition and fibrosis of skin ? ?Stiffness of right wrist, not elsewhere classified ? ?Muscle weakness (generalized) ? ? ? ?Problem List ?Ther

## 2021-08-23 ENCOUNTER — Other Ambulatory Visit: Payer: Self-pay

## 2021-08-23 ENCOUNTER — Ambulatory Visit: Payer: Medicare PPO | Admitting: Occupational Therapy

## 2021-08-23 DIAGNOSIS — L905 Scar conditions and fibrosis of skin: Secondary | ICD-10-CM

## 2021-08-23 DIAGNOSIS — M25631 Stiffness of right wrist, not elsewhere classified: Secondary | ICD-10-CM

## 2021-08-23 DIAGNOSIS — M6281 Muscle weakness (generalized): Secondary | ICD-10-CM

## 2021-08-23 NOTE — Therapy (Signed)
[place] ?[place] [facility] ?[address] ?[place], [place], [address] ?Phone: [phone]   Fax:  [phone] ? ?Occupational Therapy Treatment ? ?Patient Details  ?Name: Deborah Harrison ?MRN: [id number] ?Date of Birth: 1945-12-20 ?Referring Provider (OT): Dr Rudene Christians ? ? ?Encounter Date: 08/23/2021 ? ? OT End of Session - 08/23/21 1035   ? ? Visit Number 6   ? Number of Visits 12   ? Date for OT Re-Evaluation 09/28/21   ? OT Start Time 1035   ? OT Stop Time 1125   ? OT Time Calculation (min) 50 min   ? Activity Tolerance Patient tolerated treatment well   ? Behavior During Therapy [facility] for tasks assessed/performed   ? ?  ?  ? ?  ? ? ?Past Medical History:  ?Diagnosis Date  ? Allergy   ? seasonal  ? Cancer [facility])   ? History of abnormal Pap smear   ? 10 years ago.  ? Pneumonia   ? ? ?Past Surgical History:  ?Procedure Laterality Date  ? COLPOSCOPY    ? abnormal pap x 52yr ago.  ? ORIF WRIST FRACTURE Right 07/03/2021  ? Procedure: OPEN REDUCTION INTERNAL FIXATION (ORIF) WRIST FRACTURE;  Surgeon: MHessie Knows MD;  Location: [facility] ORS;  Service: Orthopedics;  Laterality: Right;  ? ? ?There were no vitals filed for this visit. ? ? Subjective Assessment - 08/23/21 1035   ? ? Subjective  Pain was better - can use the weight with less pain -used blue splint for rotation -and some swelling in the morning wiht stiffness - fingers sore over the back side   ? Pertinent History Seen at Ortho on 07/16/21 for her first postop appointment following a open reduction and internal fixation of her right distal radius fracture. Surgery was performed by Dr. MRudene Christianson 07/03/2021. The patient is doing well. She is taking Tylenol sparingly for discomfort. Pain score today is a 2 out of 10. She states that after being taken out of the short arm splint at her skin check and into the Velcro wrist splint her pain has significantly improved. She denies any falls or trauma affecting the right wrist since her  last evaluation. She denies any numbness or tingling to the right hand or wrist at this time. She denies any signs of infection at home such as fevers or any drainage from the right wrist incision. She presents today for x-rays of the right wrist and suture removal. Stitches removed and sterri strips applied - referral to OT   ? Patient Stated Goals Want to be able to use my hand to crochet, knit and get back to yoga   ? Currently in Pain? Yes   ? Pain Score 2    ? Pain Location Hand   ? Pain Orientation Right   ? Pain Descriptors / Indicators Aching   ? Pain Type Surgical pain   ? Pain Onset More than a month ago   ? Pain Frequency Intermittent   ? ?  ?  ? ?  ? ? ? ? ? [facility] OT Assessment - 08/23/21 0001   ? ?  ? AROM  ? Right Wrist Extension 63 Degrees   ? Right Wrist Flexion 65 Degrees   ? ?  ?  ? ?  ? ? ? ? ? ? ? ? ? ? ? OT Treatments/Exercises (OP) - 08/23/21 0001   ? ?  ? RUE Fluidotherapy  ? Number Minutes Fluidotherapy 8 Minutes   ?  RUE Fluidotherapy Location Hand;Wrist   ? Comments increase ROM   ? ?  ?  ? ?  ? ? ?Scar massage done - improving -  kinesiotape to parallel 2  30% pull and across 3 100 % pull ? ? Scar massage done over cica scar pad using mini massager and manual  ?  ?Reviewed again [facility] for RD, UD, flexion, ext of wrist edge of table -and PROM end range for sup/pro ? ? prayer stretch and prolonged flexion stretch - ?1 pound weight to smaller circumference to decrease pain over dorsal fourth and fifth metacarpal and ulnar wrist  ?1 lbs for wrist in all planes-  ?12 reps  2 sets  ?Can increase to 3rd set of the weekend if pain free ?Patient to keep pain-free endrange supination pronation as well as use blue Benik neoprene as needed ?Patient was pain-free with changes to HEP ?2 x day after stretches   ?Cont tendon glides- but do AAROM  ?12 reps ?2 x day  ?After some heat  ?  ? ? ? ? ? ? OT Education - 08/23/21 1035   ? ? Education Details progress and changes to HEP   ? Person(s) Educated  Patient;Spouse   ? Methods Explanation;Demonstration;Tactile cues;Handout;Verbal cues   ? Comprehension Verbal cues required;Returned demonstration;Verbalized understanding   ? ?  ?  ? ?  ? ? ? OT Short Term Goals - 08/03/21 2041   ? ?  ? OT SHORT TERM GOAL #1  ? Title Pt to be independent in HEP  to decrease scar tissue and increase wrist AROM   ? Baseline no knowledge of HEP -sterri strips removed today ,  wrist splint on most all the time -wrist ROM decrease   ? Time 3   ? Period Weeks   ? Status New   ? Target Date 08/24/21   ? ?  ?  ? ?  ? ? ? ? OT Long Term Goals - 08/03/21 2042   ? ?  ? OT LONG TERM GOAL #1  ? Title R wrist AROM increase to [facility] to turn doorknob, pull and push door open and wash back   ? Baseline R wrist ext 40 , flexion 33, RD 25, UD 22   ? Time 6   ? Period Weeks   ? Status New   ? Target Date 09/14/21   ?  ? OT LONG TERM GOAL #2  ? Title R wrist strength increase for pt to push and pull heavy door, carry more than 8 lbs and push up from chair without increase symptoms   ? Baseline No strength - AROM limited - 4 1/2 wks s/p   ? Time 8   ? Period Weeks   ? Status New   ? Target Date 09/28/21   ?  ? OT LONG TERM GOAL #3  ? Title R grip and prehension strength increase to more than 60 % compare to L hand to carry more than 8 lbs, carry plate and open packages with out increase symptoms   ? Baseline NT - 4 1/2 wks s/p - decrease AROM for wrist - wrist splint most of the time   ? Time 8   ? Period Weeks   ? Status New   ? Target Date 09/28/21   ? ?  ?  ? ?  ? ? ? ? ? ? ? ? Plan - 08/23/21 1035   ? ? Clinical Impression Statement Patient presents 71/2 weeks postop  ORIF radius fracture-Colles' fracture patient report decreased pain since last time with use of smaller circumference 1 pound weight and keeping exercises pain-free.  Pain better at night with a little bit of swelling and stiffness in the fingers in the morning.  Patient continues to have tenderness over the third digit A1 pulley but no  triggering-still holding off on putty.  Patient still  with soreness over the dorsal fifth and fourth metacarpal.  Patient stay in the pain-free range for supination pronation.  Had less pain in clinic today patient to continue to focus on scar massage to decrease scar adhesion.  Was able to upgrade patient to 2 sets of 10-12 reps for wrist in all planes pain-free, can increase to a third set over the weekend if pain-free.  Patient to use Benik neoprene splint as needed.  Patient continues to benefit from skilled OT services to decrease scar tissue, increased range of motion, increase strength to return to prior level of function   ? OT Occupational Profile and History Problem Focused Assessment - Including review of records relating to presenting problem   ? Occupational performance deficits (Please refer to evaluation for details): ADL's;IADL's;Play;Leisure;Social Participation   ? Body Structure / Function / Physical Skills ADL;Strength;Decreased knowledge of precautions;Skin integrity;Flexibility;Scar mobility;IADL;ROM;Edema;Pain;UE functional use   ? Rehab Potential Good   ? Clinical Decision Making Limited treatment options, no task modification necessary   ? Comorbidities Affecting Occupational Performance: None   ? Modification or Assistance to Complete Evaluation  No modification of tasks or assist necessary to complete eval   ? OT Frequency --   1-2 x wk  ? OT Duration 8 weeks   ? OT Treatment/Interventions Self-care/ADL training;Paraffin;Manual Therapy;Patient/family education;Passive range of motion;Therapeutic exercise;Scar mobilization;Fluidtherapy;Splinting   ? Consulted and Agree with Plan of Care Patient;Family member/caregiver   ? ?  ?  ? ?  ? ? ?Patient will benefit from skilled therapeutic intervention in order to improve the following deficits and impairments:   ?Body Structure / Function / Physical Skills: ADL, Strength, Decreased knowledge of precautions, Skin integrity, Flexibility, Scar  mobility, IADL, ROM, Edema, Pain, UE functional use ?  ?  ? ? ?Visit Diagnosis: ?Scar condition and fibrosis of skin ? ?Stiffness of right wrist, not elsewhere classified ? ?Muscle weakness (generalized) ? ? ? ?Pro

## 2021-08-28 ENCOUNTER — Other Ambulatory Visit: Payer: Self-pay

## 2021-08-28 ENCOUNTER — Ambulatory Visit: Payer: Medicare PPO | Admitting: Occupational Therapy

## 2021-08-28 DIAGNOSIS — M6281 Muscle weakness (generalized): Secondary | ICD-10-CM

## 2021-08-28 DIAGNOSIS — L905 Scar conditions and fibrosis of skin: Secondary | ICD-10-CM

## 2021-08-28 DIAGNOSIS — M25631 Stiffness of right wrist, not elsewhere classified: Secondary | ICD-10-CM

## 2021-08-28 NOTE — Therapy (Signed)
[place] [facility] ?[address] ?[place], [place], [address] ?Phone: [phone]   Fax:  [phone] ? ?Occupational Therapy Treatment ? ?Patient Details  ?Name: Deborah Harrison ?MRN: [id number] ?Date of Birth: [birth year] ?Referring Provider (OT): Dr Rudene Christians ? ? ?Encounter Date: 08/28/2021 ? ? OT End of Session - 08/28/21 1035   ? ? Visit Number 7   ? Number of Visits 12   ? Date for OT Re-Evaluation 09/28/21   ? OT Start Time 1035   ? OT Stop Time 1122   ? OT Time Calculation (min) 47 min   ? Activity Tolerance Patient tolerated treatment well   ? Behavior During Therapy [facility] for tasks assessed/performed   ? ?  ?  ? ?  ? ? ?Past Medical History:  ?Diagnosis Date  ? Allergy   ? seasonal  ? Cancer [facility])   ? History of abnormal Pap smear   ? 10 years ago.  ? Pneumonia   ? ? ?Past Surgical History:  ?Procedure Laterality Date  ? COLPOSCOPY    ? abnormal pap x 20yr ago.  ? ORIF WRIST FRACTURE Right 07/03/2021  ? Procedure: OPEN REDUCTION INTERNAL FIXATION (ORIF) WRIST FRACTURE;  Surgeon: MHessie Knows MD;  Location: [facility] ORS;  Service: Orthopedics;  Laterality: Right;  ? ? ?There were no vitals filed for this visit. ? ? Subjective Assessment - 08/28/21 1035   ? ? Subjective  My hand and wrist has been doing better-I like to sleep with a soft blue brace do not feel pain anymore at nighttime little stiffness in the morning-was able to do 1 pound weight 3 sets.  When my splint is off I can tell I have low more strength and use   ? Pertinent History Seen at Ortho on 07/16/21 for her first postop appointment following a open reduction and internal fixation of her right distal radius fracture. Surgery was performed by Dr. MRudene Christianson 07/03/2021. The patient is doing well. She is taking Tylenol sparingly for discomfort. Pain score today is a 2 out of 10. She states that after being taken out of the short arm splint at her skin check and into the Velcro wrist splint her pain has  significantly improved. She denies any falls or trauma affecting the right wrist since her last evaluation. She denies any numbness or tingling to the right hand or wrist at this time. She denies any signs of infection at home such as fevers or any drainage from the right wrist incision. She presents today for x-rays of the right wrist and suture removal. Stitches removed and sterri strips applied - referral to OT   ? Patient Stated Goals Want to be able to use my hand to crochet, knit and get back to yoga   ? Currently in Pain? No/denies   ? ?  ?  ? ?  ? ? ? ? ? [facility] OT Assessment - 08/28/21 0001   ? ?  ? AROM  ? Right Wrist Extension 65 Degrees   ? Right Wrist Flexion 75 Degrees   ?  ? Strength  ? Right Hand Grip (lbs) 45   ? Right Hand Lateral Pinch 9 lbs   ? Right Hand 3 Point Pinch 10 lbs   ? Left Hand Grip (lbs) 26   ? Left Hand Lateral Pinch 12 lbs   ? Left Hand 3 Point Pinch 12 lbs   ? ?  ?  ? ?  ? ? ? ? ? ? ? ? ? ? ?  OT Treatments/Exercises (OP) - 08/28/21 0001   ? ?  ? RUE Paraffin  ? Number Minutes Paraffin 8 Minutes   ? RUE Paraffin Location Hand   wrist  ? Comments flexion, ext stretch 2 min x 2 each   ? ?  ?  ? ?  ? ? ? ?Scar massage done - improving -use piece of [place] to do scar massage patient and husband ed on use of it and precaution  ?Use extractor for scar mopes with active range of motion of wrist and manual scar massage by OT   ?  ?Done prolonged flexion extension stretch for wrist using heating pad for patient to do at home 2 minutes each-done it during ? ?In today with great success ?Cont  prayer stretch and prolonged flexion stretch  prior to weight  ? ?Upgrade pt to 2 pound weight to smaller circumference to decrease pain over dorsal fourth and fifth metacarpal and ulnar wrist  ?1 x 12 reps - 2 x day  ?Pain free  ?blue Benik neoprene as needed ?Add table slides for wrist ext to HEP  ? ? ?Cont tendon glides- but do AAROM  ?12 reps ?2 x day  ?After some heat  ? ? ? ? ? OT Education -  08/28/21 1035   ? ? Education Details progress and changes to HEP   ? Person(s) Educated Patient;Spouse   ? Methods Explanation;Demonstration;Tactile cues;Handout;Verbal cues   ? Comprehension Verbal cues required;Returned demonstration;Verbalized understanding   ? ?  ?  ? ?  ? ? ? OT Short Term Goals - 08/03/21 2041   ? ?  ? OT SHORT TERM GOAL #1  ? Title Pt to be independent in HEP  to decrease scar tissue and increase wrist AROM   ? Baseline no knowledge of HEP -sterri strips removed today ,  wrist splint on most all the time -wrist ROM decrease   ? Time 3   ? Period Weeks   ? Status New   ? Target Date 08/24/21   ? ?  ?  ? ?  ? ? ? ? OT Long Term Goals - 08/03/21 2042   ? ?  ? OT LONG TERM GOAL #1  ? Title R wrist AROM increase to [facility] to turn doorknob, pull and push door open and wash back   ? Baseline R wrist ext 40 , flexion 33, RD 25, UD 22   ? Time 6   ? Period Weeks   ? Status New   ? Target Date 09/14/21   ?  ? OT LONG TERM GOAL #2  ? Title R wrist strength increase for pt to push and pull heavy door, carry more than 8 lbs and push up from chair without increase symptoms   ? Baseline No strength - AROM limited - 4 1/2 wks s/p   ? Time 8   ? Period Weeks   ? Status New   ? Target Date 09/28/21   ?  ? OT LONG TERM GOAL #3  ? Title R grip and prehension strength increase to more than 60 % compare to L hand to carry more than 8 lbs, carry plate and open packages with out increase symptoms   ? Baseline NT - 4 1/2 wks s/p - decrease AROM for wrist - wrist splint most of the time   ? Time 8   ? Period Weeks   ? Status New   ? Target Date 09/28/21   ? ?  ?  ? ?  ? ? ? ? ? ? ? ?  Plan - 08/28/21 1035   ? ? Clinical Impression Statement Patient presents 8 1/2 weeks postop ORIF radius fracture-Colles' fracture - Pain better at night with a little bit of swelling and stiffness in the fingers in the morning.  Patient continues to have 2/10 tenderness at right third digit A1 pulley but no triggering-still holding off on  putty.  Able to upgrade patient this day to 2 pound weight for wrist in all planes with a placed on hold for wrist extension-add prolonged flexion and extension stretch for wrist.  Had less pain in clinic today patient to continue to focus on scar massage to decrease scar adhesion.  Assist grip and prehension strength this date- patient to use Benik neoprene splint as needed.  Patient continues to benefit from skilled OT services to decrease scar tissue, increased range of motion, increase strength to return to prior level of function   ? OT Occupational Profile and History Problem Focused Assessment - Including review of records relating to presenting problem   ? Occupational performance deficits (Please refer to evaluation for details): ADL's;IADL's;Play;Leisure;Social Participation   ? Body Structure / Function / Physical Skills ADL;Strength;Decreased knowledge of precautions;Skin integrity;Flexibility;Scar mobility;IADL;ROM;Edema;Pain;UE functional use   ? Rehab Potential Good   ? Clinical Decision Making Limited treatment options, no task modification necessary   ? Comorbidities Affecting Occupational Performance: None   ? Modification or Assistance to Complete Evaluation  No modification of tasks or assist necessary to complete eval   ? OT Frequency --   1-2 x wk  ? OT Duration 8 weeks   ? OT Treatment/Interventions Self-care/ADL training;Paraffin;Manual Therapy;Patient/family education;Passive range of motion;Therapeutic exercise;Scar mobilization;Fluidtherapy;Splinting   ? Consulted and Agree with Plan of Care Patient;Family member/caregiver   ? ?  ?  ? ?  ? ? ?Patient will benefit from skilled therapeutic intervention in order to improve the following deficits and impairments:   ?Body Structure / Function / Physical Skills: ADL, Strength, Decreased knowledge of precautions, Skin integrity, Flexibility, Scar mobility, IADL, ROM, Edema, Pain, UE functional use ?  ?  ? ? ?Visit Diagnosis: ?Scar condition and  fibrosis of skin ? ?Stiffness of right wrist, not elsewhere classified ? ?Muscle weakness (generalized) ? ? ? ?Problem List ?There are no problems to display for this patient. ? ? ?Rosalyn Gess, OTR/L,CLT ?3/28

## 2021-08-30 ENCOUNTER — Ambulatory Visit: Payer: Medicare PPO | Admitting: Occupational Therapy

## 2021-08-30 DIAGNOSIS — L905 Scar conditions and fibrosis of skin: Secondary | ICD-10-CM

## 2021-08-30 DIAGNOSIS — M25631 Stiffness of right wrist, not elsewhere classified: Secondary | ICD-10-CM

## 2021-08-30 DIAGNOSIS — M6281 Muscle weakness (generalized): Secondary | ICD-10-CM

## 2021-08-30 NOTE — Therapy (Signed)
[place] ?[place] [facility] ?[address] ?[place], [place], [address] ?Phone: [phone]   Fax:  [phone] ? ?Occupational Therapy Treatment ? ?Patient Details  ?Name: Deborah Harrison ?MRN: [id number] ?Date of Birth: Sep 26, 1945 ?Referring Provider (OT): Dr Rudene Christians ? ? ?Encounter Date: 08/30/2021 ? ? OT End of Session - 08/30/21 1424   ? ? Visit Number 8   ? Number of Visits 12   ? Date for OT Re-Evaluation 09/28/21   ? OT Start Time 1117   ? OT Stop Time 1210   ? OT Time Calculation (min) 53 min   ? Activity Tolerance Patient tolerated treatment well   ? Behavior During Therapy [facility] for tasks assessed/performed   ? ?  ?  ? ?  ? ? ?Past Medical History:  ?Diagnosis Date  ? Allergy   ? seasonal  ? Cancer [facility])   ? History of abnormal Pap smear   ? 10 years ago.  ? Pneumonia   ? ? ?Past Surgical History:  ?Procedure Laterality Date  ? COLPOSCOPY    ? abnormal pap x 88yr ago.  ? ORIF WRIST FRACTURE Right 07/03/2021  ? Procedure: OPEN REDUCTION INTERNAL FIXATION (ORIF) WRIST FRACTURE;  Surgeon: MHessie Knows MD;  Location: [facility] ORS;  Service: Orthopedics;  Laterality: Right;  ? ? ?There were no vitals filed for this visit. ? ? Subjective Assessment - 08/30/21 1423   ? ? Subjective  I can tell my hand and wrist is getting better using that more around the house normally but light activities.  Still bothers me some evenings or nights over the top of my hand in my pinky but not every night anymore.  Middle finger still bothers me at times.  But not every day   ? Pertinent History Seen at Ortho on 07/16/21 for her first postop appointment following a open reduction and internal fixation of her right distal radius fracture. Surgery was performed by Dr. MRudene Christianson 07/03/2021. The patient is doing well. She is taking Tylenol sparingly for discomfort. Pain score today is a 2 out of 10. She states that after being taken out of the short arm splint at her skin check and into the Velcro wrist  splint her pain has significantly improved. She denies any falls or trauma affecting the right wrist since her last evaluation. She denies any numbness or tingling to the right hand or wrist at this time. She denies any signs of infection at home such as fevers or any drainage from the right wrist incision. She presents today for x-rays of the right wrist and suture removal. Stitches removed and sterri strips applied - referral to OT   ? Patient Stated Goals Want to be able to use my hand to crochet, knit and get back to yoga   ? Currently in Pain? No/denies   ? ?  ?  ? ?  ? ? ? ? ? [facility] OT Assessment - 08/30/21 0001   ? ?  ? AROM  ? Right Wrist Extension 66 Degrees   ? Right Wrist Flexion 70 Degrees   ? ?  ?  ? ?  ? ? ? ?  Patient continues to be tender at third A1 pulley 4/10 this date but denies triggering.  Discussed with patient use of ionto with dexamethasone or shot from orthopedic doctor if not improving next week. ?Did fabricate a MC block ring splint for third digit to wear at nighttime as well as with activities during  the day that involves a composite fist. ?Patient fitted and educated on use. ? ? ? ? ? ? ? OT Treatments/Exercises (OP) - 08/30/21 0001   ? ?  ? RUE Paraffin  ? Number Minutes Paraffin 8 Minutes   ? RUE Paraffin Location Wrist   ? Comments flexion , ext stretch for wrist using heatingpad 2 min each   ? ?  ?  ? ?  ? ? ? ?Scar massage done - improving -use piece of [place] to do scar massage patient and husband ed on use of it and precaution  ?Use extractor for scar mobs with active range of motion of wrist and  digits -manual scar massage by OT   ?Provided patient and husband again Kinesiotape to apply as in the past during the weekend for scar mobilization-verbalized understanding ?  ?Done prolonged flexion extension stretch for wrist using heating pad for patient to do at home 2 minutes each-done it during ? ?In today with great success ?Cont  prayer stretch and prolonged flexion stretch   prior to weight  ?  ?Reviewed again 2 pound weight with patient-gripping not too tight or flipping during the radial and ulnar deviation ?Patient to continue 2 pound weight with smaller circumference to decrease pain over dorsal fourth and fifth metacarpal and ulnar wrist  ?1 x 12 reps - 2 x day  ?Pain free  ?blue Benik neoprene as needed ?Patient can go to a second set over the weekend if no increased pain ?Reviewed table slides for wrist ext to HEP-do not force or do weightbearing that cause discomfort over dorsal hand ?  ?  ?Cont tendon glides- but do AAROM  ?12 reps ?2 x day  ?After some heat  ?Patient can increase activities as pain allows around home ? ? ? ? ? OT Education - 08/30/21 1424   ? ? Education Details progress and changes to HEP   ? Person(s) Educated Patient;Spouse   ? Methods Explanation;Demonstration;Tactile cues;Handout;Verbal cues   ? Comprehension Verbal cues required;Returned demonstration;Verbalized understanding   ? ?  ?  ? ?  ? ? ? OT Short Term Goals - 08/03/21 2041   ? ?  ? OT SHORT TERM GOAL #1  ? Title Pt to be independent in HEP  to decrease scar tissue and increase wrist AROM   ? Baseline no knowledge of HEP -sterri strips removed today ,  wrist splint on most all the time -wrist ROM decrease   ? Time 3   ? Period Weeks   ? Status New   ? Target Date 08/24/21   ? ?  ?  ? ?  ? ? ? ? OT Long Term Goals - 08/03/21 2042   ? ?  ? OT LONG TERM GOAL #1  ? Title R wrist AROM increase to [facility] to turn doorknob, pull and push door open and wash back   ? Baseline R wrist ext 40 , flexion 33, RD 25, UD 22   ? Time 6   ? Period Weeks   ? Status New   ? Target Date 09/14/21   ?  ? OT LONG TERM GOAL #2  ? Title R wrist strength increase for pt to push and pull heavy door, carry more than 8 lbs and push up from chair without increase symptoms   ? Baseline No strength - AROM limited - 4 1/2 wks s/p   ? Time 8   ? Period Weeks   ? Status New   ?  Target Date 09/28/21   ?  ? OT LONG TERM GOAL #3  ? Title  R grip and prehension strength increase to more than 60 % compare to L hand to carry more than 8 lbs, carry plate and open packages with out increase symptoms   ? Baseline NT - 4 1/2 wks s/p - decrease AROM for wrist - wrist splint most of the time   ? Time 8   ? Period Weeks   ? Status New   ? Target Date 09/28/21   ? ?  ?  ? ?  ? ? ? ? ? ? ? ? Plan - 08/30/21 1425   ? ? Clinical Impression Statement Patient presents 8 1/2 weeks postop ORIF radius fracture-Colles' fracture - Pain better at night-some soreness over her posterior fifth digit and dorsal hand but not every night- little bit of swelling and stiffness in the fingers in the morning.  Patient continues to have 4/10 tenderness at right third digit A1 pulley this date but no triggering-still holding off on putty.  Able to upgrade patient this week to 2 pound weight for wrist in all planes with a placed on hold for wrist extension-add prolonged flexion and extension stretch for wrist.  Did add last time table slides for composite wrist extension, reviewed with patient again may be pushing or straining causing discomfort on dorsal hand.  Reviewed also gripping and use of 2 pound weight during exercises to decrease pain and fifth digit.  Had less pain in clinic today patient to continue to focus on scar massage to decrease scar adhesion.  Patient to use Benik neoprene splint as needed.  Patient continues to benefit from skilled OT services to decrease scar tissue, increased range of motion, increase strength to return to prior level of function   ? OT Occupational Profile and History Problem Focused Assessment - Including review of records relating to presenting problem   ? Occupational performance deficits (Please refer to evaluation for details): ADL's;IADL's;Play;Leisure;Social Participation   ? Body Structure / Function / Physical Skills ADL;Strength;Decreased knowledge of precautions;Skin integrity;Flexibility;Scar mobility;IADL;ROM;Edema;Pain;UE  functional use   ? Rehab Potential Good   ? Clinical Decision Making Limited treatment options, no task modification necessary   ? Comorbidities Affecting Occupational Performance: None   ? Modification or Assist

## 2021-09-04 ENCOUNTER — Ambulatory Visit: Payer: Medicare PPO | Attending: Student | Admitting: Occupational Therapy

## 2021-09-04 DIAGNOSIS — L905 Scar conditions and fibrosis of skin: Secondary | ICD-10-CM | POA: Diagnosis not present

## 2021-09-04 DIAGNOSIS — M6281 Muscle weakness (generalized): Secondary | ICD-10-CM | POA: Insufficient documentation

## 2021-09-04 DIAGNOSIS — M25631 Stiffness of right wrist, not elsewhere classified: Secondary | ICD-10-CM | POA: Diagnosis present

## 2021-09-04 NOTE — Therapy (Signed)
[place] [facility] ?[address] ?[place], [place], [address] ?Phone: [phone]   Fax:  [phone] ? ?Occupational Therapy Treatment ? ?Patient Details  ?Name: Deborah Harrison ?MRN: [id number] ?Date of Birth: [birth year] ?Referring Provider (OT): Dr Rudene Christians ? ? ?Encounter Date: 09/04/2021 ? ? OT End of Session - 09/04/21 1306   ? ? Visit Number 9   ? Number of Visits 12   ? Date for OT Re-Evaluation 09/28/21   ? OT Start Time 1032   ? OT Stop Time 1116   ? OT Time Calculation (min) 44 min   ? Activity Tolerance Patient tolerated treatment well   ? Behavior During Therapy [facility] for tasks assessed/performed   ? ?  ?  ? ?  ? ? ?Past Medical History:  ?Diagnosis Date  ? Allergy   ? seasonal  ? Cancer [facility])   ? History of abnormal Pap smear   ? 10 years ago.  ? Pneumonia   ? ? ?Past Surgical History:  ?Procedure Laterality Date  ? COLPOSCOPY    ? abnormal pap x 37yr ago.  ? ORIF WRIST FRACTURE Right 07/03/2021  ? Procedure: OPEN REDUCTION INTERNAL FIXATION (ORIF) WRIST FRACTURE;  Surgeon: MHessie Knows MD;  Location: [facility] ORS;  Service: Orthopedics;  Laterality: Right;  ? ? ?There were no vitals filed for this visit. ? ? Subjective Assessment - 09/04/21 1305   ? ? 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  After that will splint for my middle finger it feels much better in the morning.  Pain in the middle finger is much better   ? Pertinent History Seen at Ortho on 07/16/21 for her first postop appointment following a open reduction and internal fixation of her right distal radius fracture. Surgery was performed by Dr. MRudene Christianson 07/03/2021. The patient is doing well. She is taking Tylenol sparingly for discomfort. Pain score today is a 2 out of 10. She states that after being taken out of the short arm splint at her skin check and into the  Velcro wrist splint her pain has significantly improved. She denies any falls or trauma affecting the right wrist since her last evaluation. She denies any numbness or tingling to the right hand or wrist at this time. She denies any signs of infection at home such as fevers or any drainage from the right wrist incision. She presents today for x-rays of the right wrist and suture removal. Stitches removed and sterri strips applied - referral to OT   ? Patient Stated Goals Want to be able to use my hand to crochet, knit and get back to yoga   ? Currently in Pain? No/denies   ? ?  ?  ? ?  ? ? ? ? ? [facility] OT Assessment - 09/04/21 0001   ? ?  ? AROM  ? Right Wrist Extension 70 Degrees   ? Right Wrist Flexion 80 Degrees   ?  ? Strength  ? Right Hand Grip (lbs) 30   ? Right Hand Lateral Pinch 9 lbs   ? Right Hand 3 Point Pinch 11 lbs   ? Left Hand Grip (lbs) 45   ? Left Hand Lateral Pinch 12 lbs   ? Left Hand 3 Point Pinch 12 lbs   ? ?  ?  ? ?  ? ? ? ? ? ? ? ? ?  Patient is tenderness over third A1 pulley 1/10 this date and denies triggering.  Did fabricate a MC block ring splint for third digit to wear at nighttime last visit as well as with activities during the day that involves a composite fist. ?Patient report did not had any symptoms or pain in the morning after wearing splint  ?Pain better in third digit ?  ?  ?  ?  ?  ?  ?  ?  ?  ?  ? ? OT Treatments/Exercises (OP) - 09/04/21 0001   ? ?  ? RUE Paraffin  ? Number Minutes Paraffin 8 Minutes   ? RUE Paraffin Location Wrist   ? Comments Flexion extension stretch 2 minutes each with heating pad   ? ?  ?  ? ?  ? ? ? ?Scar massage done - improving -use piece of Coban to do scar massage patient and husband ed on use of it and precaution  ?Use extractor for scar mobs with active range of motion of wrist and  digits -manual scar massage by OT   ?Provided patient and husband again Kinesiotape to apply and can change position to decrease skin irritation ?Done prolonged flexion  extension stretch for wrist using heating pad for patient to do at home 2 minutes each-done it during ? ?In today with great success ?Cont  prayer stretch and prolonged flexion stretch  prior to weight  ?  ?Reviewed again 2 pound weight with patient-gripping not too tight or flipping during the radial and ulnar deviation ?Patient to continue 2 pound weight with smaller circumference to decrease pain over dorsal fourth and fifth metacarpal and ulnar wrist  ?Patient can do 2 sets for supination/pronation, radial/ulnar deviation ?1 set of wrist extension flexion-do not do placed on hold anymore to decrease strain over dorsal hand ?1 x 12 reps - 2 x day  ?Pain free  ?blue Benik neoprene as needed ?Continue with table slides for wrist ext to HEP-do not force or do weightbearing that cause discomfort over dorsal hand ?Review and add to home program 10 Wall push-ups patient able to do without increased symptoms ?  ?  ?Add teal medium putty for patient for lateral and three-point pinch 12-15 reps 2 times a day pain-free ?Patient to hold off on grip because of third digit tenderness over A1 pulley in the past and grip improving  ? ? ? ? ? OT Education - 09/04/21 1306   ? ? Education Details progress and changes to HEP   ? Person(s) Educated Patient;Spouse   ? Methods Explanation;Demonstration;Tactile cues;Handout;Verbal cues   ? Comprehension Verbal cues required;Returned demonstration;Verbalized understanding   ? ?  ?  ? ?  ? ? ? OT Short Term Goals - 08/03/21 2041   ? ?  ? OT SHORT TERM GOAL #1  ? Title Pt to be independent in HEP  to decrease scar tissue and increase wrist AROM   ? Baseline no knowledge of HEP -sterri strips removed today ,  wrist splint on most all the time -wrist ROM decrease   ? Time 3   ? Period Weeks   ? Status New   ? Target Date 08/24/21   ? ?  ?  ? ?  ? ? ? ? OT Long Term Goals - 08/03/21 2042   ? ?  ? OT LONG TERM GOAL #1  ? Title R wrist AROM increase to [facility] to turn doorknob, pull and push door  open and wash back   ?  Baseline R wrist ext 40 , flexion 33, RD 25, UD 22   ? Time 6   ? Period Weeks   ? Status New   ? Target Date 09/14/21   ?  ? OT LONG TERM GOAL #2  ? Title R wrist strength increase for pt to push and pull heavy door, carry more than 8 lbs and push up from chair without increase symptoms   ? Baseline No strength - AROM limited - 4 1/2 wks s/p   ? Time 8   ? Period Weeks   ? Status New   ? Target Date 09/28/21   ?  ? OT LONG TERM GOAL #3  ? Title R grip and prehension strength increase to more than 60 % compare to L hand to carry more than 8 lbs, carry plate and open packages with out increase symptoms   ? Baseline NT - 4 1/2 wks s/p - decrease AROM for wrist - wrist splint most of the time   ? Time 8   ? Period Weeks   ? Status New   ? Target Date 09/28/21   ? ?  ?  ? ?  ? ? ? ? ? ? ? ? Plan - 09/04/21 1307   ? ? Clinical Impression Statement Patient presents 9 weeks postop ORIF radius fracture-Colles' fracture.  Patient report less discomfort and pain in third digit-tenderness 1/10 this date over A1 pulley.  Denies any triggering.  Still some soreness over the back of hand and fifth digit after doing 2 pound weight-change this date home program 2 pound weight for wrist extension to active motion, and not placed on hold.  Patient can continue to do 2 sets of 10 for radial, ulnar deviation and supination/pronation.  .Patient continues to make in progress in wrist extension flexion as well as grip strength and three-point pinch.  At this date medium teal putty for lateral grip and three-point pinch 1 set of 12 1-2 times a day.  Able to do table slides for composite wrist extension with no issues at this date wall push-ups 10 reps with no increase symptoms.  Added to home program.   Reviewed again also gripping and use of 2 pound weight during exercises to decrease pain at fifth digit.  Had less pain in clinic today patient to continue to focus on scar massage to decrease scar adhesion.  Patient  to use Benik neoprene splint as needed.  Patient continues to benefit from skilled OT services to decrease scar tissue, increased range of motion, increase strength to return to prior level of functi

## 2021-09-06 ENCOUNTER — Ambulatory Visit: Payer: Medicare PPO | Admitting: Occupational Therapy

## 2021-09-06 DIAGNOSIS — M25631 Stiffness of right wrist, not elsewhere classified: Secondary | ICD-10-CM

## 2021-09-06 DIAGNOSIS — L905 Scar conditions and fibrosis of skin: Secondary | ICD-10-CM | POA: Diagnosis not present

## 2021-09-06 DIAGNOSIS — M6281 Muscle weakness (generalized): Secondary | ICD-10-CM

## 2021-09-06 NOTE — Therapy (Signed)
[place] [facility] ?[address] ?[place], [place], [address] ?Phone: [phone]   Fax:  [phone] ? ?Occupational Therapy Treatment ? ?Patient Details  ?Name: Deborah Harrison ?MRN: [id number] ?Date of Birth: [birth year] ?Referring Provider (OT): Dr Rudene Christians ? ? ?Encounter Date: 09/06/2021 ? ? OT End of Session - 09/06/21 1225   ? ? Visit Number 10   ? Number of Visits 12   ? Date for OT Re-Evaluation 09/28/21   ? OT Start Time 1030   ? OT Stop Time 1118   ? OT Time Calculation (min) 48 min   ? Activity Tolerance Patient tolerated treatment well   ? Behavior During Therapy [facility] for tasks assessed/performed   ? ?  ?  ? ?  ? ? ?Past Medical History:  ?Diagnosis Date  ? Allergy   ? seasonal  ? Cancer [facility])   ? History of abnormal Pap smear   ? 10 years ago.  ? Pneumonia   ? ? ?Past Surgical History:  ?Procedure Laterality Date  ? COLPOSCOPY    ? abnormal pap x 61yr ago.  ? ORIF WRIST FRACTURE Right 07/03/2021  ? Procedure: OPEN REDUCTION INTERNAL FIXATION (ORIF) WRIST FRACTURE;  Surgeon: MHessie Knows MD;  Location: [facility] ORS;  Service: Orthopedics;  Laterality: Right;  ? ? ?There were no vitals filed for this visit. ? ? Subjective Assessment - 09/06/21 1224   ? ? Subjective  I am doing better I can still feel a little bit of a pull on my pinky.  But I am using my hand more normally now I can see.  My husband tells me the same I am getting more muscle in my right arm again.   ? Pertinent History Seen at Ortho on 07/16/21 for her first postop appointment following a open reduction and internal fixation of her right distal radius fracture. Surgery was performed by Dr. MRudene Christianson 07/03/2021. The patient is doing well. She is taking Tylenol sparingly for discomfort. Pain score today is a 2 out of 10. She states that after being taken out of the short arm splint at her skin check and into the Velcro wrist splint her pain has significantly improved. She denies any falls or trauma  affecting the right wrist since her last evaluation. She denies any numbness or tingling to the right hand or wrist at this time. She denies any signs of infection at home such as fevers or any drainage from the right wrist incision. She presents today for x-rays of the right wrist and suture removal. Stitches removed and sterri strips applied - referral to OT   ? Patient Stated Goals Want to be able to use my hand to crochet, knit and get back to yoga   ? Currently in Pain? No/denies   ? ?  ?  ? ?  ? ? ? ? ? ? ?  This date simulated some activities with patient using her right hand for pushing and pulling heavy door open.  Patient able to do with some weakness but no pain.  Patient could carry with no pain 5-6 pounds at side of body.  Had some discomfort at wrist with 7 pounds.  Patient could do 3 pound weight overhead for elbow and shoulder strengthening.  Did feel more comfortable using her Benik neoprene splint when adding 3 pound to home exercises. ?Patient can do 3 pounds for elbow flexion, elbow extension, punches and scapular squeezes-1 time a day 8 repetitions pain-free. ?  ?  Patient and husband continue with same scar massage at home -can use piece of Coban to do scar massage patient  ?   ?Provided patient and husband again Kinesiotape to apply and can change position to decrease skin irritation ?Patient to continue with prolonged flexion /extension stretch for wrist using heating pad for patient to do at home 2 minutes each-done it during ? ?In today with great success ?Cont  prayer stretch and prolonged flexion stretch  prior to weight  ?  ?Reviewed again 2 pound weight with patient-gripping not too tight or flipping during the radial and ulnar deviation ?Patient to continue 2 pound weight with smaller circumference to decrease pain over dorsal fourth and fifth metacarpal and ulnar wrist  ?Patient can do 3 sets for supination/pronation, radial/ulnar deviation ?Decreased patient this date to 1 pound for   wrist extension flexion ?But 3 sets of 12 also pain-free  ?2 times day  ? ?blue Benik neoprene as needed ?Continue with table slides for wrist ext to HEP-do not force or do weightbearing that cause discomfort over dorsal hand ?To do table slides prior to 2 sets 10 Wall push-ups  ?patient able to do without increased symptoms this date again ?  ?  ?Continue teal medium putty for patient for lateral and three-point pinch increase to 2 sets of 12-15 reps 2 times a day pain-free ? ? ? ? ? ? ? ? ? ? ? ? ? ? ? ? OT Education - 09/06/21 1224   ? ? Education Details progress and changes to HEP   ? Person(s) Educated Patient;Spouse   ? Methods Explanation;Demonstration;Tactile cues;Handout;Verbal cues   ? Comprehension Verbal cues required;Returned demonstration;Verbalized understanding   ? ?  ?  ? ?  ? ? ? OT Short Term Goals - 08/03/21 2041   ? ?  ? OT SHORT TERM GOAL #1  ? Title Pt to be independent in HEP  to decrease scar tissue and increase wrist AROM   ? Baseline no knowledge of HEP -sterri strips removed today ,  wrist splint on most all the time -wrist ROM decrease   ? Time 3   ? Period Weeks   ? Status New   ? Target Date 08/24/21   ? ?  ?  ? ?  ? ? ? ? OT Long Term Goals - 08/03/21 2042   ? ?  ? OT LONG TERM GOAL #1  ? Title R wrist AROM increase to [facility] to turn doorknob, pull and push door open and wash back   ? Baseline R wrist ext 40 , flexion 33, RD 25, UD 22   ? Time 6   ? Period Weeks   ? Status New   ? Target Date 09/14/21   ?  ? OT LONG TERM GOAL #2  ? Title R wrist strength increase for pt to push and pull heavy door, carry more than 8 lbs and push up from chair without increase symptoms   ? Baseline No strength - AROM limited - 4 1/2 wks s/p   ? Time 8   ? Period Weeks   ? Status New   ? Target Date 09/28/21   ?  ? OT LONG TERM GOAL #3  ? Title R grip and prehension strength increase to more than 60 % compare to L hand to carry more than 8 lbs, carry plate and open packages with out increase symptoms   ?  Baseline NT - 4 1/2 wks s/p - decrease AROM  for wrist - wrist splint most of the time   ? Time 8   ? Period Weeks   ? Status New   ? Target Date 09/28/21   ? ?  ?  ? ?  ? ? ? ? ? ? ? ? Plan - 09/06/21 1225   ? ? Clinical Impression Statement Patient presents 10 weeks postop ORIF radius fracture-Colles' fracture.  Patient showed great progress from start of care and digit and wrist active range of motion in the right dominant hand.  Patient to continue to have some discomfort on ulnar side of hand and pinky with 2 pound weight.  Patient this week was able to start driving again as well as working as a Scientist, water quality at [employer].  Simulated some activities today in clinic able to push and pull heavy door open with no pain was able to carry at side 5 to 6 pounds with no pain.  Was able to do 3 pounds for elbow and shoulder exercises.  Patient can use her Benik neoprene wrist wrap during elbow and shoulder exercises at home.  Decreased her wrist extension to 1 pound weight but can do 3 sets of 12.  Continue with 2 pound weight for supination/pronation and radial deviation/ulnar deviation.  .Reviewed with patient and husband home program again-continue to focus on scar massage to decrease scar adhesion.  Patient to use Benik neoprene splint as needed.  Patient continues to benefit from skilled OT services to decrease scar tissue, increased range of motion, increase strength to return to prior level of function   ? OT Occupational Profile and History Problem Focused Assessment - Including review of records relating to presenting problem   ? Occupational performance deficits (Please refer to evaluation for details): ADL's;IADL's;Play;Leisure;Social Participation   ? Body Structure / Function / Physical Skills ADL;Strength;Decreased knowledge of precautions;Skin integrity;Flexibility;Scar mobility;IADL;ROM;Edema;Pain;UE functional use   ? Rehab Potential Good   ? Clinical Decision Making Limited treatment options, no  task modification necessary   ? Comorbidities Affecting Occupational Performance: None   ? Modification or Assistance to Complete Evaluation  No modification of tasks or assist necessary to complete eval

## 2021-09-11 ENCOUNTER — Ambulatory Visit: Payer: Medicare PPO | Admitting: Occupational Therapy

## 2021-09-11 DIAGNOSIS — M6281 Muscle weakness (generalized): Secondary | ICD-10-CM

## 2021-09-11 DIAGNOSIS — M25631 Stiffness of right wrist, not elsewhere classified: Secondary | ICD-10-CM

## 2021-09-11 DIAGNOSIS — L905 Scar conditions and fibrosis of skin: Secondary | ICD-10-CM | POA: Diagnosis not present

## 2021-09-11 NOTE — Therapy (Signed)
[place] [facility] ?[address] ?[place], [place], [address] ?Phone: [phone]   Fax:  [phone] ? ?Occupational Therapy Treatment ? ?Patient Details  ?Name: Deborah Harrison ?MRN: [id number] ?Date of Birth: 09-07-1945 ?Referring Provider (OT): Dr Rudene Christians ? ? ?Encounter Date: 09/11/2021 ? ? OT End of Session - 09/11/21 1248   ? ? Visit Number 11   ? Number of Visits 12   ? Date for OT Re-Evaluation 09/28/21   ? OT Start Time 1034   ? OT Stop Time 1118   ? OT Time Calculation (min) 44 min   ? Activity Tolerance Patient tolerated treatment well   ? Behavior During Therapy [facility] for tasks assessed/performed   ? ?  ?  ? ?  ? ? ?Past Medical History:  ?Diagnosis Date  ? Allergy   ? seasonal  ? Cancer [facility])   ? History of abnormal Pap smear   ? 10 years ago.  ? Pneumonia   ? ? ?Past Surgical History:  ?Procedure Laterality Date  ? COLPOSCOPY    ? abnormal pap x 27yr ago.  ? ORIF WRIST FRACTURE Right 07/03/2021  ? Procedure: OPEN REDUCTION INTERNAL FIXATION (ORIF) WRIST FRACTURE;  Surgeon: MHessie Knows MD;  Location: [facility] ORS;  Service: Orthopedics;  Laterality: Right;  ? ? ?There were no vitals filed for this visit. ? ? Subjective Assessment - 09/11/21 1247   ? ? Subjective  I am doing better I using my hand more I found myself using it without thinking.  Doing things more around the house and I have done the 3 pound weight with my husband for my arms.  And feels better also my middle finger and pinky.   ? Pertinent History Seen at Ortho on 07/16/21 for her first postop appointment following a open reduction and internal fixation of her right distal radius fracture. Surgery was performed by Dr. MRudene Christianson 07/03/2021. The patient is doing well. She is taking Tylenol sparingly for discomfort. Pain score today is a 2 out of 10. She states that after being taken out of the short arm splint at her skin check and into the Velcro wrist splint her pain has significantly improved. She  denies any falls or trauma affecting the right wrist since her last evaluation. She denies any numbness or tingling to the right hand or wrist at this time. She denies any signs of infection at home such as fevers or any drainage from the right wrist incision. She presents today for x-rays of the right wrist and suture removal. Stitches removed and sterri strips applied - referral to OT   ? Patient Stated Goals Want to be able to use my hand to crochet, knit and get back to yoga   ? Currently in Pain? No/denies   ? ?  ?  ? ?  ? ? ? ? ? [facility] OT Assessment - 09/11/21 0001   ? ?  ? AROM  ? Right Wrist Extension 65 Degrees   74 after PROM  ? Right Wrist Flexion 70 Degrees   80 after PROM  ?  ? Strength  ? Right Hand Grip (lbs) 30   ? Right Hand Lateral Pinch 11 lbs   ? Right Hand 3 Point Pinch 11 lbs   ? Left Hand Grip (lbs) 45   ? Left Hand Lateral Pinch 12 lbs   ? Left Hand 3 Point Pinch 12 lbs   ? ?  ?  ? ?  ? ? ? ?  Patient show increased prehension strength but grip the same.  Patient still to hold off on putty gripping but can continue with prehension, increase to 2 sets in today and 3 sets in 3 days if no increased pain. ? ? ? ? ? ? ? OT Treatments/Exercises (OP) - 09/11/21 0001   ? ?  ? RUE Paraffin  ? Number Minutes Paraffin 8 Minutes   ? RUE Paraffin Location Wrist   ? Comments PROM 2 min each x 2 flexion, ext stretch   ? ?  ?  ? ?  ? ? ?  Patient arrived with decreased wrist flexion extension-reviewed with patient importance of stretches for flexion and extension prior to weights.  As well as scar adhesion still limiting composite flexion extension wrist.  ? Patient to continue with 3 pound weight overhead for elbow and shoulder strengthening.  Did feel more comfortable using her Benik neoprene splint when adding 3 pound to home exercises. ?Patient can do 3 pounds for elbow flexion, elbow extension, punches and scapular squeezes-1 time a day 8 repetitions pain-free. ?  ?  Patient and husband continue with  same scar massage at home -can use piece of Coban to do scar massage patient  ?   ?Focus on scar massage this date by OT manually as well as using extractor with passive and active range of motion for wrist flexion extension composite.  Patient to continue with prolonged flexion /extension stretch for wrist using heating pad for patient to do at home 2 minutes each-done in session with great progress. ? ?  ?Reviewed again 2 pound weight with patient-gripping not too tight or flipping during the radial and ulnar deviation ?Patient to continue 2 pound weight with smaller circumference to decrease pain over dorsal fourth and fifth metacarpal and ulnar wrist  ?Patient can do 3 sets for supination/pronation, radial/ulnar deviation ?Increase patient this date to 2 pounds pound for  wrist extension flexion ?But decrease to 1 set of 12 pain-free  ?2 times day  ?  ?blue Benik neoprene as needed ?Continue with table slides for wrist ext to HEP-do not force or do weightbearing that cause discomfort over dorsal hand ?To do table slides prior to 2 sets 10 Wall push-ups  ?patient able to do without increased symptoms this date again ?  ?  ?Continue teal medium putty for patient for lateral and three-point pinch increase to 2 sets of 12-15 reps 2 times a day pain-free ?  ?  ?  ? ? ? ? ? ? OT Education - 09/11/21 1248   ? ? Education Details progress and changes to HEP   ? Person(s) Educated Patient;Spouse   ? Methods Explanation;Demonstration;Tactile cues;Handout;Verbal cues   ? Comprehension Verbal cues required;Returned demonstration;Verbalized understanding   ? ?  ?  ? ?  ? ? ? OT Short Term Goals - 08/03/21 2041   ? ?  ? OT SHORT TERM GOAL #1  ? Title Pt to be independent in HEP  to decrease scar tissue and increase wrist AROM   ? Baseline no knowledge of HEP -sterri strips removed today ,  wrist splint on most all the time -wrist ROM decrease   ? Time 3   ? Period Weeks   ? Status New   ? Target Date 08/24/21   ? ?  ?  ? ?   ? ? ? ? OT Long Term Goals - 08/03/21 2042   ? ?  ? OT LONG TERM GOAL #1  ?  Title R wrist AROM increase to [facility] to turn doorknob, pull and push door open and wash back   ? Baseline R wrist ext 40 , flexion 33, RD 25, UD 22   ? Time 6   ? Period Weeks   ? Status New   ? Target Date 09/14/21   ?  ? OT LONG TERM GOAL #2  ? Title R wrist strength increase for pt to push and pull heavy door, carry more than 8 lbs and push up from chair without increase symptoms   ? Baseline No strength - AROM limited - 4 1/2 wks s/p   ? Time 8   ? Period Weeks   ? Status New   ? Target Date 09/28/21   ?  ? OT LONG TERM GOAL #3  ? Title R grip and prehension strength increase to more than 60 % compare to L hand to carry more than 8 lbs, carry plate and open packages with out increase symptoms   ? Baseline NT - 4 1/2 wks s/p - decrease AROM for wrist - wrist splint most of the time   ? Time 8   ? Period Weeks   ? Status New   ? Target Date 09/28/21   ? ?  ?  ? ?  ? ? ? ? ? ? ? ? Plan - 09/11/21 1248   ? ? Clinical Impression Statement Patient presents 11 weeks postop ORIF radius fracture-Colles' fracture.  Patient showed great progress from start of care and digit and wrist active range of motion in the right dominant hand.  Patient the last week show increase functional use and home activities and was able to do 3 pounds for elbow and shoulder exercises this past week.  Patient can use her Benik neoprene wrist wrap during elbow and shoulder exercises at home.  Patient continues to show decreased wrist flexion and extension coming in this date, reviewed with patient again passive stretches to do as well as scar massage.  Showed afterwards 10 degrees of increased flexion extension at wrist.  Was able to upgrade her to 2 pounds per 12 reps only for wrist extension and flexion pain-free but can do 3 sets of 12 reps with 2 pound weight for supination/pronation and radial deviation/ulnar deviation.  .Reviewed with patient and husband home  program again-continue to focus on scar massage to decrease scar adhesion.  Patient to use Benik neoprene splint as needed.  Patient continues to benefit from skilled OT services to decrease scar tissue, incre

## 2021-09-18 ENCOUNTER — Encounter: Payer: Self-pay | Admitting: Occupational Therapy

## 2021-09-18 ENCOUNTER — Ambulatory Visit: Payer: Medicare PPO | Admitting: Occupational Therapy

## 2021-09-18 DIAGNOSIS — L905 Scar conditions and fibrosis of skin: Secondary | ICD-10-CM

## 2021-09-18 DIAGNOSIS — M6281 Muscle weakness (generalized): Secondary | ICD-10-CM

## 2021-09-18 DIAGNOSIS — M25631 Stiffness of right wrist, not elsewhere classified: Secondary | ICD-10-CM

## 2021-09-18 NOTE — Therapy (Signed)
[place] [facility] [facility] ?[address] ?[place], [place], [address] ?Phone: [phone]   Fax:  [phone] ? ?Occupational Therapy Treatment ? ?Patient Details  ?Name: Deborah Harrison ?MRN: [id number] ?Date of Birth: 05-09-1946 ?Referring Provider (OT): Dr Rudene Christians ? ? ?Encounter Date: 09/18/2021 ? ? OT End of Session - 09/18/21 1032   ? ? Visit Number 12   ? Number of Visits 14   ? Date for OT Re-Evaluation 10/16/21   ? OT Start Time [phone]   ? OT Stop Time 1116   ? OT Time Calculation (min) 43 min   ? Activity Tolerance Patient tolerated treatment well   ? Behavior During Therapy [facility] for tasks assessed/performed   ? ?  ?  ? ?  ? ? ?Past Medical History:  ?Diagnosis Date  ? Allergy   ? seasonal  ? Cancer [facility])   ? History of abnormal Pap smear   ? 10 years ago.  ? Pneumonia   ? ? ?Past Surgical History:  ?Procedure Laterality Date  ? COLPOSCOPY    ? abnormal pap x 102yr ago.  ? ORIF WRIST FRACTURE Right 07/03/2021  ? Procedure: OPEN REDUCTION INTERNAL FIXATION (ORIF) WRIST FRACTURE;  Surgeon: MHessie Knows MD;  Location: [facility] ORS;  Service: Orthopedics;  Laterality: Right;  ? ? ?There were no vitals filed for this visit. ? ? Subjective Assessment - 09/18/21 1032   ? ? Subjective  Doing much better I am considering using my hand more without thinking at home.  Less pain.  I am still sleeping with my soft blue splint and middle finger long splint at night.  In the morning have some stiffness in the pinky still at times and middle finger.  But no pain   ? Pertinent History Seen at Ortho on 07/16/21 for her first postop appointment following a open reduction and internal fixation of her right distal radius fracture. Surgery was performed by Dr. MRudene Christianson 07/03/2021. The patient is doing well. She is taking Tylenol sparingly for discomfort. Pain score today is a 2 out of 10. She states that after being taken out of the short arm splint at her skin check and into the Velcro wrist  splint her pain has significantly improved. She denies any falls or trauma affecting the right wrist since her last evaluation. She denies any numbness or tingling to the right hand or wrist at this time. She denies any signs of infection at home such as fevers or any drainage from the right wrist incision. She presents today for x-rays of the right wrist and suture removal. Stitches removed and sterri strips applied - referral to OT   ? Patient Stated Goals Want to be able to use my hand to crochet, knit and get back to yoga   ? Currently in Pain? No/denies   ? ?  ?  ? ?  ? ? ? ? ? [facility] OT Assessment - 09/18/21 0001   ? ?  ? AROM  ? Right Wrist Extension 70 Degrees   ? Right Wrist Flexion 80 Degrees   ?  ? Strength  ? Right Hand Grip (lbs) 31   ? Right Hand Lateral Pinch 11 lbs   ? Right Hand 3 Point Pinch 12 lbs   ? Left Hand Grip (lbs) 45   ? Left Hand Lateral Pinch 12 lbs   ? Left Hand 3 Point Pinch 12 lbs   ? ?  ?  ? ?  ? ? ? ?  Patient arrived this date with increased wrist flexion extension after addressing and reviewing home program for stretches last time.  ?Patient report able to do at home 3 pounds for elbow flexion, elbow extension, punches and scapular squeezes-1 time a day 8 repetitions pain-free. ?Assess patient use of right hand again with pushing and pulling heavy door, pushing up from chair, carry 8 to 10 pounds to side was able to lift for pounds but a hard time lifting 5 pounds with elbow. ?Upgrade patient to rate therapy in this date for elbow flexion, elbow extension, scapular retraction and shoulder extension 1-2 sets of 8-12 depending on exercise.  Patient noted over grip.  1 time a day after stretches for wrist flexion extension using heat prior. ?  ?  Patient and husband can continue with same scar massage at home -can use piece of Coban to do scar massage patient  ?   ? ?  ?She can continue with 2 pound weight  3 sets for supination/pronation ?And 2 sets of 12 for wrist extension flexion,  can increase to 30 set if pain-free in a week ?1 time a day ?  ?Can continue  with table slides for wrist ext to HEP-do not force or do weightbearing that cause discomfort over dorsal hand ?To do table slides prior to 2 sets 10 Wall push-ups /or chair push-ups ?patient able to do without increased symptoms t ?  ?  ?Continue teal medium putty for patient for lateral and three-point pinch increase to 2 sets of 12-15 reps 2 times a day pain-free ?Patient can wean out of Benik neoprene splint and finger MP block splint for third at nighttime gradually over the next week or 2 if no increase symptoms.  Follow-up in 2 weeks with me ?  ?  ?  ?  ? ? ? ? ? ? ? ? ? ? ? ? ? ? ? OT Education - 09/18/21 1032   ? ? Education Details progress and changes to HEP   ? Person(s) Educated Patient;Spouse   ? Methods Explanation;Demonstration;Tactile cues;Handout;Verbal cues   ? Comprehension Verbal cues required;Returned demonstration;Verbalized understanding   ? ?  ?  ? ?  ? ? ? OT Short Term Goals - 09/18/21 1318   ? ?  ? OT SHORT TERM GOAL #1  ? Title Pt to be independent in HEP  to decrease scar tissue and increase wrist AROM   ? Status Achieved   ? ?  ?  ? ?  ? ? ? ? OT Long Term Goals - 09/18/21 1318   ? ?  ? OT LONG TERM GOAL #1  ? Title R wrist AROM increase to [facility] to turn doorknob, pull and push door open and wash back   ? Status Achieved   ?  ? OT LONG TERM GOAL #2  ? Title R wrist strength increase for pt to push and pull heavy door, carry more than 8 lbs and push up from chair without increase symptoms   ? Status Achieved   ?  ? OT LONG TERM GOAL #3  ? Title R grip and prehension strength increase to more than 60 % compare to L hand to carry more than 8 lbs, carry plate and open packages with out increase symptoms   ? Baseline NT - 4 1/2 wks s/p - decrease AROM for wrist - wrist splint most of the time; Grip staying at 30 lbs - middle finger and 5th [place] 1-2/10 but no triggering - upgrade  to band HEP this date - hard time  lifting but can carry   ? Time 4   ? Period Weeks   ? Status On-going   ? Target Date 10/16/21   ? ?  ?  ? ?  ? ? ? ? ? ? ? ? Plan - 09/18/21 1033   ? ? Clinical Impression Statement Patient presents 12 weeks postop ORIF radius fracture-Colles' fracture.  Patient made great progress from start of care especially the last 2 to 3 weeks and increased functional use of right dominant hand.  Able to do 2 pound weight now for wrist without Benik neoprene splint as well as 3 pounds for shoulder and elbow exercises.  The state patient arrived with increased wrist flexion extension, reporting doing her stretches at home.  Pain better in the hand mostly still some stiffness in the third digit and fifth but no triggering reported in the third.  They state patient was able to push, pull heavy door with more ease carrying 10 pounds in right hand unable to lift.  Could push up from chair with no issues.  Upgrade and change home exercises to red Thera-Band for elbow flexion, extension, scapular retraction and shoulder extension.  Patient can wean out of Benik neoprene and third digit MCP block splint over the next week or 2 if no issues at nighttime.  Patient to continue with 2 pound weight for supination, pronation, wrist extension, flexion to 3 sets of 12 pain-free. .Reviewed with patient and husband home program again-continue to focus on scar massage to decrease scar adhesion.  Patient decrease to follow-up with me in 2 weeks.  Patient continues to benefit from skilled OT services to decrease scar tissue, increased range of motion, increase strength to return to prior level of function   ? OT Occupational Profile and History Problem Focused Assessment - Including review of records relating to presenting problem   ? Occupational performance deficits (Please refer to evaluation for details): ADL's;IADL's;Play;Leisure;Social Participation   ? Body Structure / Function / Physical Skills ADL;Strength;Decreased knowledge of  precautions;Skin integrity;Flexibility;Scar mobility;IADL;ROM;Edema;Pain;UE functional use   ? Rehab Potential Good   ? Clinical Decision Making Limited treatment options, no task modification necessary   ? [place]

## 2021-10-02 ENCOUNTER — Ambulatory Visit: Payer: Medicare PPO | Attending: Student | Admitting: Occupational Therapy

## 2021-10-02 DIAGNOSIS — L905 Scar conditions and fibrosis of skin: Secondary | ICD-10-CM | POA: Diagnosis present

## 2021-10-02 DIAGNOSIS — M25631 Stiffness of right wrist, not elsewhere classified: Secondary | ICD-10-CM | POA: Diagnosis present

## 2021-10-02 DIAGNOSIS — M6281 Muscle weakness (generalized): Secondary | ICD-10-CM | POA: Insufficient documentation

## 2021-10-02 NOTE — Therapy (Signed)
[facility] ?[address] ?[place], [place], [address] ?Phone: [phone]   Fax:  [phone] ? ?Occupational Therapy Treatment ? ?Patient Details  ?Name: Deborah Harrison ?MRN: [id number] ?Date of Birth: 06-22-1945 ?Referring Provider (OT): Dr Rudene Christians ? ? ?Encounter Date: 10/02/2021 ? ? OT End of Session - 10/02/21 1029   ? ? Visit Number 13   ? Number of Visits 14   ? Date for OT Re-Evaluation 10/16/21   ? OT Start Time 1030   ? OT Stop Time 1128   ? OT Time Calculation (min) 58 min   ? Activity Tolerance Patient tolerated treatment well   ? Behavior During Therapy [facility] for tasks assessed/performed   ? ?  ?  ? ?  ? ? ?Past Medical History:  ?Diagnosis Date  ? Allergy   ? seasonal  ? Cancer [facility])   ? History of abnormal Pap smear   ? 10 years ago.  ? Pneumonia   ? ? ?Past Surgical History:  ?Procedure Laterality Date  ? COLPOSCOPY    ? abnormal pap x 64yr ago.  ? ORIF WRIST FRACTURE Right 07/03/2021  ? Procedure: OPEN REDUCTION INTERNAL FIXATION (ORIF) WRIST FRACTURE;  Surgeon: MHessie Knows MD;  Location: [facility] ORS;  Service: Orthopedics;  Laterality: Right;  ? ? ?There were no vitals filed for this visit. ? ? Subjective Assessment - 10/02/21 1029   ? ? Subjective  I am doing better I am not thinking about it anymore using my hand he just comes automatically.  Still not doing heavy things still holding my left hand underneath if I pick up something heavy with my right.  Weight is getting easier, can I do you yoga yet   ? Pertinent History Seen at Ortho on 07/16/21 for her first postop appointment following a open reduction and internal fixation of her right distal radius fracture. Surgery was performed by Dr. MRudene Christianson 07/03/2021. The patient is doing well. She is taking Tylenol sparingly for discomfort. Pain score today is a 2 out of 10. She states that after being taken out of the short arm splint at her skin check and into the Velcro wrist splint her pain has  significantly improved. She denies any falls or trauma affecting the right wrist since her last evaluation. She denies any numbness or tingling to the right hand or wrist at this time. She denies any signs of infection at home such as fevers or any drainage from the right wrist incision. She presents today for x-rays of the right wrist and suture removal. Stitches removed and sterri strips applied - referral to OT   ? Patient Stated Goals Want to be able to use my hand to crochet, knit and get back to yoga   ? Currently in Pain? No/denies   ? ?  ?  ? ?  ? ? ? ? ? [facility] OT Assessment - 10/02/21 0001   ? ?  ? AROM  ? Right Wrist Extension 70 Degrees   ? Right Wrist Flexion 80 Degrees   ? Left Wrist Extension 70 Degrees   ? Left Wrist Flexion 90 Degrees   ?  ? Strength  ? Right Hand Grip (lbs) 36   ? Right Hand Lateral Pinch 12 lbs   ? Right Hand 3 Point Pinch 12 lbs   ? Left Hand Grip (lbs) 45   ? Left Hand Lateral Pinch 12 lbs   ? Left Hand 3 Point Pinch 12  lbs   ? ?  ?  ? ?  ? ? ? ? ? ? ? ?  Patient returned this date after doing home program for 2 weeks at home.  Patient reported using her hand more except when lifting anything heavy supporting with the left hand underneath.  Was able to drive her stick car 1 time.  As well as work in [employer].  She returned to crocheting again but not knitting yet. ?Patient denies any pain but mostly complains of stiffness in the morning especially in the third digit and fifth digit. ?No tenderness over A1 pulley at third and fifth digit. ?Grip strength increase greatly from last time without putty.  Did not use putty for gripping because of third trigger finger since ORIF. ? ?Patient report able to do at home 3 pounds for elbow flexion, elbow extension, punches and scapular squeezes-1 time a day 8 repetitions pain-free. ?Upgrade this date patient to 4 pounds for upper body strengthening, but to do 3 times a week going with husband to the gym.  Reviewed with patient to do so  safely without injury. ? ?Assess patient use of right hand again with pushing and pulling heavy door, pushing up from chair, carry 8 pounds to side was able to lift 5-6 pounds  ? ?  Patient continue with wrist extension and flexion stretches in the shower in the morning as well as table slides and wall push-ups or wall slides.  To prepare her for yoga. ?Attempted this date to do quad stand on floor unable to weight shift 50-50 on bilateral hands.  Patient can start back with slowly yoga doing warrior pose and nonweightbearing or weightbearing through the elbow. ?  ?  ?Patient can stop with 2 pound weight for wrist but added teal putty for supination and pronation strengthening. ?Wrist extension, flexion, radial ulnar deviation was a 5 out of 5. ?Patient to return in a month. ? ? ? ? ? ? ? ? ? ? OT Education - 10/02/21 1029   ? ? Education Details progress and changes to HEP   ? Person(s) Educated Patient;Spouse   ? Methods Explanation;Demonstration;Tactile cues;Handout;Verbal cues   ? Comprehension Verbal cues required;Returned demonstration;Verbalized understanding   ? ?  ?  ? ?  ? ? ? OT Short Term Goals - 09/18/21 1318   ? ?  ? OT SHORT TERM GOAL #1  ? Title Pt to be independent in HEP  to decrease scar tissue and increase wrist AROM   ? Status Achieved   ? ?  ?  ? ?  ? ? ? ? OT Long Term Goals - 09/18/21 1318   ? ?  ? OT LONG TERM GOAL #1  ? Title R wrist AROM increase to [facility] to turn doorknob, pull and push door open and wash back   ? Status Achieved   ?  ? OT LONG TERM GOAL #2  ? Title R wrist strength increase for pt to push and pull heavy door, carry more than 8 lbs and push up from chair without increase symptoms   ? Status Achieved   ?  ? OT LONG TERM GOAL #3  ? Title R grip and prehension strength increase to more than 60 % compare to L hand to carry more than 8 lbs, carry plate and open packages with out increase symptoms   ? Baseline NT - 4 1/2 wks s/p - decrease AROM for wrist - wrist splint most of the  time;  Grip staying at 30 lbs - middle finger and 5th Aqpulley 1-2/10 but no triggering - upgrade to band HEP this date - hard time lifting but can carry   ? Time 4   ? Period Weeks   ? Status On-going   ? Target Date 10/16/21   ? ?  ?  ? ?  ? ? ? ? ? ? ? ? Plan - 10/02/21 1030   ? ? Clinical Impression Statement Patient is about 3 months postop ORIF radius fracture-Colles' fracture.  Patient returns today after doing home program for 2 weeks.  Made great progress in active range of motion as well as grip and prehension strength.  Was able to do 2 pounds for wrist in all planes as well as 3 pounds for upper body.  Patient reports not having pain but mostly stiffness in the morning still at the wrist in the middle finger and sometimes the pinky.  For the last week not using any splints.  She is able to push, pull heavy door with more ease carrying 8 pounds in right hand but slight pull at the wrist.  Could push up from chair with no issues.  She was able to lift and carry 5 to 6 pounds with more ease.  Change her home program to using putty for supination and pronation and 3 times a week 4 pounds for upper body strengthening pain-free.  Reviewed with patient again wrist extension with some weightbearing as well as wall push-ups to prep her for yoga patient still unable to bear weight through her wrist..Reviewed with patient and husband home program again-continue to focus on scar massage with a wrist in flexion.  Patient to follow-up in a month..  Patient continues to benefit from skilled OT services to decrease scar tissue, increased range of motion, increase strength to return to prior level of function   ? OT Occupational Profile and History Problem Focused Assessment - Including review of records relating to presenting problem   ? Occupational performance deficits (Please refer to evaluation for details): ADL's;IADL's;Play;Leisure;Social Participation   ? Body Structure / Function / Physical Skills  ADL;Strength;Decreased knowledge of precautions;Skin integrity;Flexibility;Scar mobility;IADL;ROM;Edema;Pain;UE functional use   ? Rehab Potential Good   ? Clinical Decision Making Limited treatment options, no task mo

## 2021-10-30 ENCOUNTER — Ambulatory Visit: Payer: Medicare PPO | Admitting: Occupational Therapy

## 2021-10-30 DIAGNOSIS — M25631 Stiffness of right wrist, not elsewhere classified: Secondary | ICD-10-CM

## 2021-10-30 DIAGNOSIS — L905 Scar conditions and fibrosis of skin: Secondary | ICD-10-CM

## 2021-10-30 DIAGNOSIS — M6281 Muscle weakness (generalized): Secondary | ICD-10-CM

## 2021-10-30 NOTE — Therapy (Signed)
[facility] [address] [place], [address] Phone: [phone]   Fax:  [phone]  Occupational Therapy Treatment  Patient Details  Name: Deborah Harrison MRN: [id number] Date of Birth: 1945-12-04 Referring Provider (OT): Dr Rudene Christians   Encounter Date: 10/30/2021   OT End of Session - 10/30/21 1212     Visit Number 14    Number of Visits 15    Date for OT Re-Evaluation 12/11/21    OT Start Time 1036    OT Stop Time 1120    OT Time Calculation (min) 44 min    Activity Tolerance Patient tolerated treatment well    Behavior During Therapy [facility] for tasks assessed/performed             Past Medical History:  Diagnosis Date   Allergy    seasonal   Cancer [facility])    History of abnormal Pap smear    10 years ago.   Pneumonia     Past Surgical History:  Procedure Laterality Date   COLPOSCOPY     abnormal pap x 46yr ago.   ORIF WRIST FRACTURE Right 07/03/2021   Procedure: OPEN REDUCTION INTERNAL FIXATION (ORIF) WRIST FRACTURE;  Surgeon: MHessie Knows MD;  Location: [facility] ORS;  Service: Orthopedics;  Laterality: Right;    There were no vitals filed for this visit.   Subjective Assessment - 10/30/21 1209     Subjective  I done okay since I seen you a month ago.  The gym did not have 4 pounds so I was doing extra sets but with a 3 pounds.  I have been doing light yoga doing more weightbearing here and there still bothers a little but my right wrist that I need to shift my weight to the left.  For the first time I was baking bread and did some dough mixing.  Stiff in the morning but then I do my stretches since better.  Middle finger still bothers me at times little.    Pertinent History Seen at Ortho on 07/16/21 for her first postop appointment following a open reduction and internal fixation of her right distal radius fracture. Surgery was performed by Dr. MRudene Christianson 07/03/2021. The patient is doing well. She is taking  Tylenol sparingly for discomfort. Pain score today is a 2 out of 10. She states that after being taken out of the short arm splint at her skin check and into the Velcro wrist splint her pain has significantly improved. She denies any falls or trauma affecting the right wrist since her last evaluation. She denies any numbness or tingling to the right hand or wrist at this time. She denies any signs of infection at home such as fevers or any drainage from the right wrist incision. She presents today for x-rays of the right wrist and suture removal. Stitches removed and sterri strips applied - referral to OT    Patient Stated Goals Want to be able to use my hand to crochet, knit and get back to yoga    Currently in Pain? No/denies                [facility]       AROM   Right Wrist Extension 80 Degrees    Right Wrist Flexion 75 Degrees    Right Wrist Radial Deviation 30 Degrees    Right Wrist Ulnar Deviation 25 Degrees    Left Wrist Extension 70 Degrees  Left Wrist Flexion 90 Degrees      Strength   Right Hand Grip (lbs) 41    Right Hand Lateral Pinch 13 lbs    Right Hand 3 Point Pinch 13 lbs    Left Hand Grip (lbs) 46    Left Hand Lateral Pinch 11 lbs    Left Hand 3 Point Pinch 14 lbs            Patient returned this date after doing home program for 4 weeks at home.  Patient reports doing her yoga more at home able to do some weightbearing but modified to take weight more on the left hand.  Started baking bread again and able to lift and use the hand more and cooking.  As well as work in [employer].  She returned to crocheting again but not knitting yet. Patient denies any pain but mostly complains of stiffness in the morning .  Do feel here and there a random stiffness or pull and third digit.  But no tenderness over A1 pulley at third and fifth digit. Reviewed with patient about modifying grip avoiding static or tight grip holding a book, crocheting  knitting or cooking. Great progress in grip and prehension strength this date close to normal limits.  Patient report able to do at home 3 pounds for elbow flexion, elbow extension, punches and scapular squeezes-she could not find a 4 pounds in the gym so just increased her reps and sets just 2 sets of 12.   Patient this date was able to carry 10 pounds and left 7 pounds. Increase patient's home program to 5 pounds for elbow curls, scapular retraction and Pontious 1 set of 8-10 reps pain-free 3 times a week.    Patient continue with wrist extension and flexion stretches in the shower in the morning . Reviewed with patient her yoga poses and weightbearing, modified for patient to do a small rolled up washcloth under right palm to decrease the amount of wrist extension-did improve patient's discomfort and was able to do 50-50 compression.    Patient to follow-up as needed in 6 weeks otherwise we will discharge                  OT Education - 10/30/21 1212     Education Details progress and changes to HEP    Person(s) Educated Patient;Spouse    Methods Explanation;Demonstration;Tactile cues;Handout;Verbal cues    Comprehension Verbal cues required;Returned demonstration;Verbalized understanding              OT Short Term Goals - 09/18/21 1318       OT SHORT TERM GOAL #1   Title Pt to be independent in HEP  to decrease scar tissue and increase wrist AROM    Status Achieved               OT Long Term Goals - 10/30/21 1213       OT LONG TERM GOAL #1   Title R wrist AROM increase to [facility] to turn doorknob, pull and push door open and wash back    Status Achieved      OT LONG TERM GOAL #2   Title R wrist strength increase for pt to push and pull heavy door, carry more than 8 lbs and push up from chair without increase symptoms    Status Achieved      OT LONG TERM GOAL #3   Title R grip and prehension strength increase to more than 60 %  compare to L hand to  carry more than 8 lbs, carry plate and open packages with out increase symptoms    Baseline SHe can carry 10 lbs ,and lift 7 lbs - but heavy - but no pain -and weight bearing - more yoga - did adjust for her to put washcloth under palm -and    Status Achieved      OT LONG TERM GOAL #4   Title Weight bearing during yoga 50/50 without increase symptoms    Baseline compensate with more weight on L , decrease composite flexion of wrist    Time 6    Period Weeks    Status New    Target Date 12/11/21                   Plan - 10/30/21 1212     Clinical Impression Statement Patient is about 4 months postop ORIF radius fracture-Colles' fracture.  Patient returns today after doing home program for 4 weeks.  This date patient show great progress in the right wrist and forearm strength.  As well as grip and prehension strength close to within normal limits.  Patient able to carry 10 pounds with no increase symptoms.  And lifting 7 pounds.  Patient do show still some decreased range of motion in wrist flexion more than extension, as well as since he some discomfort over 3 A1 pulley randomly.  Reviewed with patient again on avoiding composite or tight grip, wrist flexion and extension stretches in the shower in the morning.  Patient to continue with her yoga and did discharge to modify her position of right wrist.  Patient can do 5 pounds for some upper extremity strengthening with husband in the gym at [facility]. .Reviewed with patient and husband home program again and can continue at home.  Patient to follow-up if needed in 6 weeks..  Patient continues to benefit from skilled OT services to increased range of motion, increase strength to return to prior level of function    OT Occupational Profile and History Problem Focused Assessment - Including review of records relating to presenting problem    Occupational performance deficits (Please refer to evaluation for details):  ADL's;IADL's;Play;Leisure;Social Participation    Body Structure / Function / Physical Skills ADL;Strength;Decreased knowledge of precautions;Skin integrity;Flexibility;Scar mobility;IADL;ROM;Edema;Pain;UE functional use    Rehab Potential Good    Clinical Decision Making Limited treatment options, no task modification necessary    Comorbidities Affecting Occupational Performance: None    Modification or Assistance to Complete Evaluation  No modification of tasks or assist necessary to complete eval    OT Frequency --   6 wks   OT Duration 6 weeks    OT Treatment/Interventions Self-care/ADL training;Paraffin;Manual Therapy;Patient/family education;Passive range of motion;Therapeutic exercise;Scar mobilization;Fluidtherapy;Splinting    Consulted and Agree with Plan of Care Patient;Family member/caregiver             Patient will benefit from skilled therapeutic intervention in order to improve the following deficits and impairments:   Body Structure / Function / Physical Skills: ADL, Strength, Decreased knowledge of precautions, Skin integrity, Flexibility, Scar mobility, IADL, ROM, Edema, Pain, UE functional use       Visit Diagnosis: Scar condition and fibrosis of skin - Plan: Ot plan of care cert/re-cert  Muscle weakness (generalized) - Plan: Ot plan of care cert/re-cert  Stiffness of right wrist, not elsewhere classified - Plan: Ot plan of care cert/re-cert    Problem List There are no problems to display for  this patient.   Rosalyn Gess, OTR/L,CLT 10/30/2021, 12:24 PM  [facility] [address] [place], [address] Phone: [phone]   Fax:  [phone]  Name: Genavie Boettger MRN: [id number] Date of Birth: 09-01-1945

## 2021-12-11 ENCOUNTER — Ambulatory Visit: Payer: Medicare PPO | Attending: Student | Admitting: Occupational Therapy

## 2021-12-11 DIAGNOSIS — M25631 Stiffness of right wrist, not elsewhere classified: Secondary | ICD-10-CM | POA: Insufficient documentation

## 2021-12-11 DIAGNOSIS — L905 Scar conditions and fibrosis of skin: Secondary | ICD-10-CM | POA: Diagnosis not present

## 2021-12-11 DIAGNOSIS — M6281 Muscle weakness (generalized): Secondary | ICD-10-CM | POA: Insufficient documentation

## 2021-12-11 NOTE — Therapy (Signed)
[facility] [address], [place], [address] Phone: [phone]   Fax:  [phone]  Occupational Therapy Treatment/discharge  Patient Details  Name: Deborah Harrison MRN: [id number] Date of Birth: Aug 14, 1945 Referring Provider (OT): Dr Rudene Christians   Encounter Date: 12/11/2021   OT End of Session - 12/11/21 1304     Visit Number 15    Number of Visits 15    Date for OT Re-Evaluation 12/11/21    OT Start Time 1035    OT Stop Time 1110    OT Time Calculation (min) 35 min    Activity Tolerance Patient tolerated treatment well    Behavior During Therapy [facility] for tasks assessed/performed             Past Medical History:  Diagnosis Date   Allergy    seasonal   Cancer [facility])    History of abnormal Pap smear    10 years ago.   Pneumonia     Past Surgical History:  Procedure Laterality Date   COLPOSCOPY     abnormal pap x 34yr ago.   ORIF WRIST FRACTURE Right 07/03/2021   Procedure: OPEN REDUCTION INTERNAL FIXATION (ORIF) WRIST FRACTURE;  Surgeon: MHessie Knows MD;  Location: [facility] ORS;  Service: Orthopedics;  Laterality: Right;    There were no vitals filed for this visit.   Subjective Assessment - 12/11/21 1259     Subjective  I wanted to check in with you one more time - I notice here and there some light pull in pinkie side of hand and middle finger when cutting food or after tight grip. DOing her yoga, bake bread and cook.    Pertinent History Seen at Ortho on 07/16/21 for her first postop appointment following a open reduction and internal fixation of her right distal radius fracture. Surgery was performed by Dr. MRudene Christianson 07/03/2021. The patient is doing well. She is taking Tylenol sparingly for discomfort. Pain score today is a 2 out of 10. She states that after being taken out of the short arm splint at her skin check and into the Velcro wrist splint her pain has significantly improved. She denies any falls  or trauma affecting the right wrist since her last evaluation. She denies any numbness or tingling to the right hand or wrist at this time. She denies any signs of infection at home such as fevers or any drainage from the right wrist incision. She presents today for x-rays of the right wrist and suture removal. Stitches removed and sterri strips applied - referral to OT    Patient Stated Goals Want to be able to use my hand to crochet, knit and get back to yoga    Currently in Pain? No/denies                [facility]       AROM   Right Wrist Extension 80 Degrees    Right Wrist Flexion 80 Degrees    Left Wrist Extension 80 Degrees    Left Wrist Flexion 90 Degrees      Strength   Right Hand Grip (lbs) 44    Right Hand Lateral Pinch 13 lbs    Right Hand 3 Point Pinch 11 lbs    Left Hand Grip (lbs) 48    Left Hand Lateral Pinch 11 lbs    Left Hand 3 Point Pinch 12 lbs  Wrist and forearm strength 5/5 in all planes    Patient returned this date after doing home program for 4-6 wks  Patient reports doing her yoga more at home  and full weight bearing    She returned to crocheting again but not knitting yet. Recommend to start slow and shorter sessions.  Patient denies any pain but mostly complains of stiffness in the morning .  Do feel here and there a random stiffness or pull and third digit.  But no tenderness over A1 pulley at third and fifth digit. Reviewed with patient about modifying grip avoiding static or tight grip holding a book, crocheting knitting or cooking.  Enlarge grips  Great progress in grip and prehension strength        Patient continue with wrist flexion stretches in the shower in the morning  and scar massage .Pt discharge at this time                  OT Education - 12/11/21 1304     Education Details progress and changes to HEP- discharge instructions    Person(s) Educated Patient;Spouse    Methods  Explanation;Demonstration;Tactile cues;Handout;Verbal cues    Comprehension Verbal cues required;Returned demonstration;Verbalized understanding              OT Short Term Goals - 09/18/21 1318       OT SHORT TERM GOAL #1   Title Pt to be independent in HEP  to decrease scar tissue and increase wrist AROM    Status Achieved               OT Long Term Goals - 12/11/21 1315       OT LONG TERM GOAL #4   Title Weight bearing during yoga 50/50 without increase symptoms    Baseline Able to do her sessions - weight bear with no issues    Status Achieved                   Plan - 12/11/21 1305     Clinical Impression Statement Patient is about 4 months postop ORIF radius fracture-Colles' fracture.  Patient returns today after doing home program for month to 6 wks.  This date patient show great progress in the right wrist and forearm strength.  As well as grip and prehension strength.. Pt made great progress in using her hand in ADL's and IADL's - report some discomfort in upper arm when lifting something heavy over head- pt to work on posture and cont with yoga but making sure to drop scapula and not hike it up. She never was somebody that did weights because of her size and like yoga. Independent in most of her ADL's and IADL's - do reinforce again with patient to avoidi composite or tight grip when cutting with knife- enlarge her grips and use larger joints. When starting back knitting to start short sessions and increase time. Pt made great progress- met all goals and discharge at this time .    OT Occupational Profile and History Problem Focused Assessment - Including review of records relating to presenting problem    Occupational performance deficits (Please refer to evaluation for details): ADL's;IADL's;Play;Leisure;Social Participation    Body Structure / Function / Physical Skills ADL;Strength;Decreased knowledge of precautions;Skin integrity;Flexibility;Scar  mobility;IADL;ROM;Edema;Pain;UE functional use    Rehab Potential Good    Clinical Decision Making Limited treatment options, no task modification necessary    Comorbidities Affecting Occupational Performance: None    Modification or  Assistance to Complete Evaluation  No modification of tasks or assist necessary to complete eval    OT Treatment/Interventions Self-care/ADL training;Paraffin;Manual Therapy;Patient/family education;Passive range of motion;Therapeutic exercise;Scar mobilization;Fluidtherapy;Splinting    Consulted and Agree with Plan of Care Patient;Family member/caregiver             Patient will benefit from skilled therapeutic intervention in order to improve the following deficits and impairments:   Body Structure / Function / Physical Skills: ADL, Strength, Decreased knowledge of precautions, Skin integrity, Flexibility, Scar mobility, IADL, ROM, Edema, Pain, UE functional use       Visit Diagnosis: Scar condition and fibrosis of skin  Muscle weakness (generalized)  Stiffness of right wrist, not elsewhere classified    Problem List There are no problems to display for this patient.   Rosalyn Gess, OTR/L,CLT 12/11/2021, 1:16 PM  [facility] [address] [place], [address] Phone: [phone]   Fax:  [phone]  Name: Deborah Harrison MRN: [id number] Date of Birth: 12-05-1945

## 2022-02-25 ENCOUNTER — Other Ambulatory Visit: Payer: Self-pay | Admitting: Family Medicine

## 2022-02-25 DIAGNOSIS — Z1231 Encounter for screening mammogram for malignant neoplasm of breast: Secondary | ICD-10-CM

## 2022-03-25 ENCOUNTER — Ambulatory Visit
Admission: RE | Admit: 2022-03-25 | Discharge: 2022-03-25 | Disposition: A | Discharge status: Home / Self Care | Payer: Medicare PPO | Source: Ambulatory Visit | Attending: Family Medicine | Admitting: Family Medicine

## 2022-03-25 DIAGNOSIS — Z1231 Encounter for screening mammogram for malignant neoplasm of breast: Secondary | ICD-10-CM | POA: Insufficient documentation

## 2022-03-26 ENCOUNTER — Inpatient Hospital Stay
Admission: RE | Admit: 2022-03-26 | Discharge: 2022-03-26 | Disposition: A | Discharge status: Home / Self Care | Payer: Self-pay | Source: Ambulatory Visit | Attending: *Deleted | Admitting: *Deleted

## 2022-03-26 ENCOUNTER — Other Ambulatory Visit: Payer: Self-pay | Admitting: *Deleted

## 2022-03-26 DIAGNOSIS — Z1231 Encounter for screening mammogram for malignant neoplasm of breast: Secondary | ICD-10-CM

## 2022-05-20 ENCOUNTER — Ambulatory Visit: Payer: Medicare PPO | Admitting: Dermatology

## 2022-05-21 ENCOUNTER — Encounter: Payer: Self-pay | Admitting: Dermatology

## 2022-05-21 ENCOUNTER — Ambulatory Visit: Payer: Medicare PPO | Admitting: Dermatology

## 2022-05-21 VITALS — BP 158/81 | HR 64

## 2022-05-21 DIAGNOSIS — L820 Inflamed seborrheic keratosis: Secondary | ICD-10-CM | POA: Diagnosis not present

## 2022-05-21 DIAGNOSIS — D229 Melanocytic nevi, unspecified: Secondary | ICD-10-CM

## 2022-05-21 DIAGNOSIS — Z1283 Encounter for screening for malignant neoplasm of skin: Secondary | ICD-10-CM | POA: Diagnosis not present

## 2022-05-21 DIAGNOSIS — Z85828 Personal history of other malignant neoplasm of skin: Secondary | ICD-10-CM

## 2022-05-21 DIAGNOSIS — L814 Other melanin hyperpigmentation: Secondary | ICD-10-CM

## 2022-05-21 DIAGNOSIS — L578 Other skin changes due to chronic exposure to nonionizing radiation: Secondary | ICD-10-CM

## 2022-05-21 DIAGNOSIS — L738 Other specified follicular disorders: Secondary | ICD-10-CM

## 2022-05-21 NOTE — Progress Notes (Signed)
New Patient Visit  Subjective  Deborah Harrison is a 76 y.o. female who presents for the following: tbse (New patient reports a crusty itchy at left upper back , patient has history of scc at left lower leg 7 years ago. denies family or personal history of skin cancer. ).  The patient presents for Total-Body Skin Exam (TBSE) for skin cancer screening and mole check.  The patient has spots, moles and lesions to be evaluated, some may be new or changing and the patient has concerns that these could be cancer.   The following portions of the chart were reviewed this encounter and updated as appropriate:       Review of Systems:  No other skin or systemic complaints except as noted in HPI or Assessment and Plan.  Objective  Well appearing patient in no apparent distress; mood and affect are within normal limits.  A full examination was performed including scalp, head, eyes, ears, nose, lips, neck, chest, axillae, abdomen, back, buttocks, bilateral upper extremities, bilateral lower extremities, hands, feet, fingers, toes, fingernails, and toenails. All findings within normal limits unless otherwise noted below.  left posterior neck x 1 Erythematous stuck-on, waxy papule  right medial cheek 1.3 cm waxy tan speckled patch        Assessment & Plan  Inflamed seborrheic keratosis left posterior neck x 1  Symptomatic, irritating, patient would like treated.  Destruction of lesion - left posterior neck x 1  Destruction method: cryotherapy   Informed consent: discussed and consent obtained   Lesion destroyed using liquid nitrogen: Yes   Region frozen until ice ball extended beyond lesion: Yes   Outcome: patient tolerated procedure well with no complications   Post-procedure details: wound care instructions given   Additional details:  Prior to procedure, discussed risks of blister formation, small wound, skin dyspigmentation, or rare scar following cryotherapy. Recommend Vaseline  ointment to treated areas while healing.   Lentigo right medial cheek  Vs flat SK  Benign-appearing.  Observation.  Call clinic for new or changing lesions.  Recommend daily use of broad spectrum spf 30+ sunscreen to sun-exposed areas.    Lentigines - Scattered tan macules - Due to sun exposure - Benign-appearing, observe - Recommend daily broad spectrum sunscreen SPF 30+ to sun-exposed areas, reapply every 2 hours as needed. - Call for any changes  Sebaceous Hyperplasia vs nevus Right nasal ala 3 mm small yellow papule  - Benign appearing - Observe  Seborrheic Keratoses, trunk - Stuck-on, waxy, tan-brown papules and/or plaques  - Benign-appearing - Discussed benign etiology and prognosis. - Observe - Call for any changes  Melanocytic Nevi - Tan-brown and/or pink-flesh-colored symmetric macules and papules - Benign appearing on exam today - Observation - Call clinic for new or changing moles - Recommend daily use of broad spectrum spf 30+ sunscreen to sun-exposed areas.   Hemangiomas - Red papules - Discussed benign nature - Observe - Call for any changes  Actinic Damage - Chronic condition, secondary to cumulative UV/sun exposure - diffuse scaly erythematous macules with underlying dyspigmentation - Recommend daily broad spectrum sunscreen SPF 30+ to sun-exposed areas, reapply every 2 hours as needed.  - Staying in the shade or wearing long sleeves, sun glasses (UVA+UVB protection) and wide brim hats (4-inch brim around the entire circumference of the hat) are also recommended for sun protection.  - Call for new or changing lesions.  History of Squamous Cell Carcinoma of the Skin Patient reports Left lower leg 7 year ago -  No evidence of recurrence today - Recommend regular full body skin exams - Recommend daily broad spectrum sunscreen SPF 30+ to sun-exposed areas, reapply every 2 hours as needed.  - Call if any new or changing lesions are noted between office  visits  Skin cancer screening performed today. Return in about 1 year (around 05/22/2023) for TBSE.   I, Ruthell Rummage, CMA, am acting as scribe for Brendolyn Patty, MD.  Documentation: I have reviewed the above documentation for accuracy and completeness, and I agree with the above.  Brendolyn Patty MD

## 2022-05-21 NOTE — Patient Instructions (Addendum)
Cryotherapy Aftercare  Wash gently with soap and water everyday.   Apply Vaseline and Band-Aid daily until healed.    Seborrheic Keratosis  What causes seborrheic keratoses? Seborrheic keratoses are harmless, common skin growths that first appear during adult life.  As time goes by, more growths appear.  Some people may develop a large number of them.  Seborrheic keratoses appear on both covered and uncovered body parts.  They are not caused by sunlight.  The tendency to develop seborrheic keratoses can be inherited.  They vary in color from skin-colored to gray, brown, or even black.  They can be either smooth or have a rough, warty surface.   Seborrheic keratoses are superficial and look as if they were stuck on the skin.  Under the microscope this type of keratosis looks like layers upon layers of skin.  That is why at times the top layer may seem to fall off, but the rest of the growth remains and re-grows.    Treatment Seborrheic keratoses do not need to be treated, but can easily be removed in the office.  Seborrheic keratoses often cause symptoms when they rub on clothing or jewelry.  Lesions can be in the way of shaving.  If they become inflamed, they can cause itching, soreness, or burning.  Removal of a seborrheic keratosis can be accomplished by freezing, burning, or surgery. If any spot bleeds, scabs, or grows rapidly, please return to have it checked, as these can be an indication of a skin cancer.   Melanoma ABCDEs  Melanoma is the most dangerous type of skin cancer, and is the leading cause of death from skin disease.  You are more likely to develop melanoma if you: Have light-colored skin, light-colored eyes, or red or blond hair Spend a lot of time in the sun Tan regularly, either outdoors or in a tanning bed Have had blistering sunburns, especially during childhood Have a close family member who has had a melanoma Have atypical moles or large birthmarks  Early detection  of melanoma is key since treatment is typically straightforward and cure rates are extremely high if we catch it early.   The first sign of melanoma is often a change in a mole or a new dark spot.  The ABCDE system is a way of remembering the signs of melanoma.  A for asymmetry:  The two halves do not match. B for border:  The edges of the growth are irregular. C for color:  A mixture of colors are present instead of an even brown color. D for diameter:  Melanomas are usually (but not always) greater than 98m - the size of a pencil eraser. E for evolution:  The spot keeps changing in size, shape, and color.  Please check your skin once per month between visits. You can use a small mirror in front and a large mirror behind you to keep an eye on the back side or your body.   If you see any new or changing lesions before your next follow-up, please call to schedule a visit.  Please continue daily skin protection including broad spectrum sunscreen SPF 30+ to sun-exposed areas, reapplying every 2 hours as needed when you're outdoors.   Staying in the shade or wearing long sleeves, sun glasses (UVA+UVB protection) and wide brim hats (4-inch brim around the entire circumference of the hat) are also recommended for sun protection.    Due to recent changes in healthcare laws, you may see results of your pathology and/or  your pathology and/or laboratory studies on MyChart before the doctors have had a chance to review them. We understand that in some cases there may be results that are confusing or concerning to you. Please understand that not all results are received at the same time and often the doctors may need to interpret multiple results in order to provide you with the best plan of care or course of treatment. Therefore, we ask that you please give us 2 business days to thoroughly review all your results before contacting the office for clarification. Should we see a critical lab result, you will be contacted  sooner.   If You Need Anything After Your Visit  If you have any questions or concerns for your doctor, please call our main line at [phone] and press option 4 to reach your doctor's medical assistant. If no one answers, please leave a voicemail as directed and we will return your call as soon as possible. Messages left after 4 pm will be answered the following business day.   You may also send us a message via MyChart. We typically respond to MyChart messages within 1-2 business days.  For prescription refills, please ask your pharmacy to contact our office. Our fax number is [phone].  If you have an urgent issue when the clinic is closed that cannot wait until the next business day, you can page your doctor at the number below.    Please note that while we do our best to be available for urgent issues outside of office hours, we are not available 24/7.   If you have an urgent issue and are unable to reach us, you may choose to seek medical care at your doctor's office, retail clinic, urgent care center, or emergency room.  If you have a medical emergency, please immediately call 911 or go to the emergency department.  Pager Numbers  - Dr. Kowalski: [phone]  - Dr. Moye: [phone]  - Dr. Stewart: [phone]  In the event of inclement weather, please call our main line at [phone] for an update on the status of any delays or closures.  Dermatology Medication Tips: Please keep the boxes that topical medications come in in order to help keep track of the instructions about where and how to use these. Pharmacies typically print the medication instructions only on the boxes and not directly on the medication tubes.   If your medication is too expensive, please contact our office at [phone] option 4 or send us a message through MyChart.   We are unable to tell what your co-pay for medications will be in advance as this is different depending on your insurance  coverage. However, we may be able to find a substitute medication at lower cost or fill out paperwork to get insurance to cover a needed medication.   If a prior authorization is required to get your medication covered by your insurance company, please allow us 1-2 business days to complete this process.  Drug prices often vary depending on where the prescription is filled and some pharmacies may offer cheaper prices.  The website www.goodrx.com contains coupons for medications through different pharmacies. The prices here do not account for what the cost may be with help from insurance (it may be cheaper with your insurance), but the website can give you the price if you did not use any insurance.  - You can print the associated coupon and take it with your prescription to the pharmacy.  - You may also stop   by our office during regular business hours and pick up a GoodRx coupon card.  - If you need your prescription sent electronically to a different pharmacy, notify our office through [facility] MyChart or by phone at [phone] option 4.     Si Usted Necesita Algo Despus de Su Visita  Tambin puede enviarnos un mensaje a travs de MyChart. Por lo general respondemos a los mensajes de MyChart en el transcurso de 1 a 2 das hbiles.  Para renovar recetas, por favor pida a su farmacia que se ponga en contacto con nuestra oficina. Nuestro nmero de fax es el [phone].  Si tiene un asunto urgente cuando la clnica est cerrada y que no puede esperar hasta el siguiente da hbil, puede llamar/localizar a su doctor(a) al nmero que aparece a continuacin.   Por favor, tenga en cuenta que aunque hacemos todo lo posible para estar disponibles para asuntos urgentes fuera del horario de oficina, no estamos disponibles las 24 horas del da, los 7 das de la semana.   Si tiene un problema urgente y no puede comunicarse con nosotros, puede optar por buscar atencin mdica  en el consultorio de  su doctor(a), en una clnica privada, en un centro de atencin urgente o en una sala de emergencias.  Si tiene una emergencia mdica, por favor llame inmediatamente al 911 o vaya a la sala de emergencias.  Nmeros de bper  - Dr. Kowalski: [phone]  - Dra. Moye: [phone]  - Dra. Stewart: [phone]  En caso de inclemencias del tiempo, por favor llame a nuestra lnea principal al [phone] para una actualizacin sobre el estado de cualquier retraso o cierre.  Consejos para la medicacin en dermatologa: Por favor, guarde las cajas en las que vienen los medicamentos de uso tpico para ayudarle a seguir las instrucciones sobre dnde y cmo usarlos. Las farmacias generalmente imprimen las instrucciones del medicamento slo en las cajas y no directamente en los tubos del medicamento.   Si su medicamento es muy caro, por favor, pngase en contacto con nuestra oficina llamando al [phone] y presione la opcin 4 o envenos un mensaje a travs de MyChart.   No podemos decirle cul ser su copago por los medicamentos por adelantado ya que esto es diferente dependiendo de la cobertura de su seguro. Sin embargo, es posible que podamos encontrar un medicamento sustituto a menor costo o llenar un formulario para que el seguro cubra el medicamento que se considera necesario.   Si se requiere una autorizacin previa para que su compaa de seguros cubra su medicamento, por favor permtanos de 1 a 2 das hbiles para completar este proceso.  Los precios de los medicamentos varan con frecuencia dependiendo del lugar de dnde se surte la receta y alguna farmacias pueden ofrecer precios ms baratos.  El sitio web www.goodrx.com tiene cupones para medicamentos de diferentes farmacias. Los precios aqu no tienen en cuenta lo que podra costar con la ayuda del seguro (puede ser ms barato con su seguro), pero el sitio web puede darle el precio si no utiliz ningn seguro.  - Puede imprimir el  cupn correspondiente y llevarlo con su receta a la farmacia.  - Tambin puede pasar por nuestra oficina durante el horario de atencin regular y recoger una tarjeta de cupones de GoodRx.  - Si necesita que su receta se enve electrnicamente a una farmacia diferente, informe a nuestra oficina a travs de MyChart de [facility] o por telfono llamando al [phone] y presione la opcin 4.  

## 2023-03-12 ENCOUNTER — Other Ambulatory Visit: Payer: Self-pay | Admitting: Family Medicine

## 2023-03-12 DIAGNOSIS — Z1231 Encounter for screening mammogram for malignant neoplasm of breast: Secondary | ICD-10-CM

## 2023-03-28 ENCOUNTER — Ambulatory Visit
Admission: RE | Admit: 2023-03-28 | Discharge: 2023-03-28 | Disposition: A | Discharge status: Home / Self Care | Payer: Medicare PPO | Source: Ambulatory Visit | Attending: Family Medicine | Admitting: Family Medicine

## 2023-03-28 DIAGNOSIS — Z1231 Encounter for screening mammogram for malignant neoplasm of breast: Secondary | ICD-10-CM | POA: Diagnosis present

## 2023-06-10 ENCOUNTER — Ambulatory Visit: Payer: Medicare PPO | Admitting: Dermatology

## 2023-06-10 DIAGNOSIS — W908XXA Exposure to other nonionizing radiation, initial encounter: Secondary | ICD-10-CM

## 2023-06-10 DIAGNOSIS — L578 Other skin changes due to chronic exposure to nonionizing radiation: Secondary | ICD-10-CM | POA: Diagnosis not present

## 2023-06-10 DIAGNOSIS — L820 Inflamed seborrheic keratosis: Secondary | ICD-10-CM | POA: Diagnosis not present

## 2023-06-10 DIAGNOSIS — L814 Other melanin hyperpigmentation: Secondary | ICD-10-CM | POA: Diagnosis not present

## 2023-06-10 DIAGNOSIS — Z1283 Encounter for screening for malignant neoplasm of skin: Secondary | ICD-10-CM | POA: Diagnosis not present

## 2023-06-10 DIAGNOSIS — R238 Other skin changes: Secondary | ICD-10-CM

## 2023-06-10 DIAGNOSIS — L720 Epidermal cyst: Secondary | ICD-10-CM

## 2023-06-10 DIAGNOSIS — Z872 Personal history of diseases of the skin and subcutaneous tissue: Secondary | ICD-10-CM

## 2023-06-10 DIAGNOSIS — D229 Melanocytic nevi, unspecified: Secondary | ICD-10-CM

## 2023-06-10 DIAGNOSIS — D1801 Hemangioma of skin and subcutaneous tissue: Secondary | ICD-10-CM

## 2023-06-10 DIAGNOSIS — L738 Other specified follicular disorders: Secondary | ICD-10-CM

## 2023-06-10 DIAGNOSIS — L729 Follicular cyst of the skin and subcutaneous tissue, unspecified: Secondary | ICD-10-CM

## 2023-06-10 DIAGNOSIS — Z85828 Personal history of other malignant neoplasm of skin: Secondary | ICD-10-CM

## 2023-06-10 DIAGNOSIS — L821 Other seborrheic keratosis: Secondary | ICD-10-CM

## 2023-06-10 IMAGING — CT CT WRIST*R* W/O CM
3 of 6 series · 11 of 35 positions shown, 12 images · non-contrast
Comparison: None.

CLINICAL DATA: Right wrist fracture.  Fell 06/21/2021.

EXAM:
CT OF THE RIGHT WRIST WITHOUT CONTRAST
TECHNIQUE: Multidetector CT imaging of the right wrist was performed according
to the standard protocol. Multiplanar CT image reconstructions were
also generated.
RADIATION DOSE REDUCTION: This exam was performed according to the
departmental dose-optimization program which includes automated
exposure control, adjustment of the mA and/or kV according to
patient size and/or use of iterative reconstruction technique.

[Series 4: axial bone · axial · 0.19mm/px · z∈[-303,-184]mm · 4 of 188 slices shown, 5 images]
[im 32/188  soft-tissue]
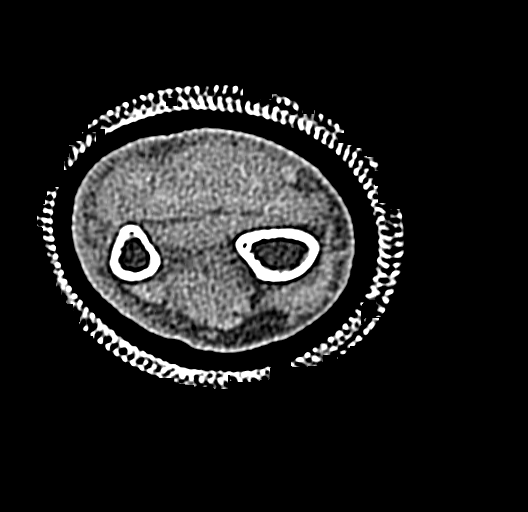
[im 32/188  bone]
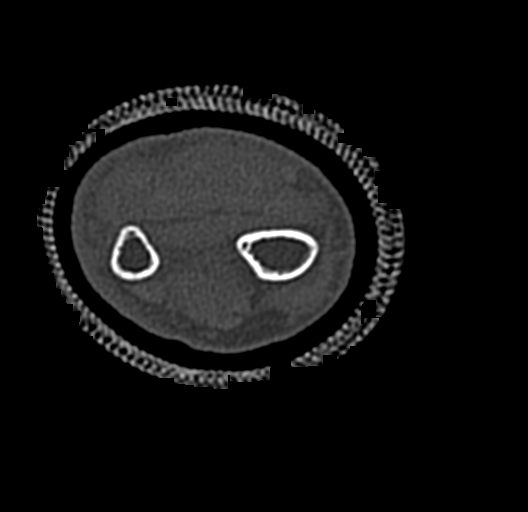
[im 63/188  bone]
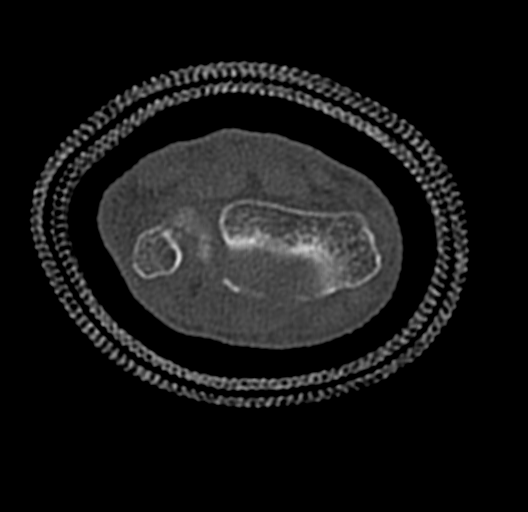
[im 125/188  bone]
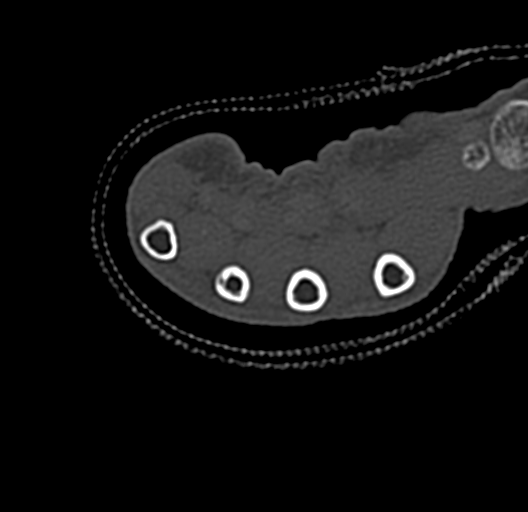
[im 156/188  bone]
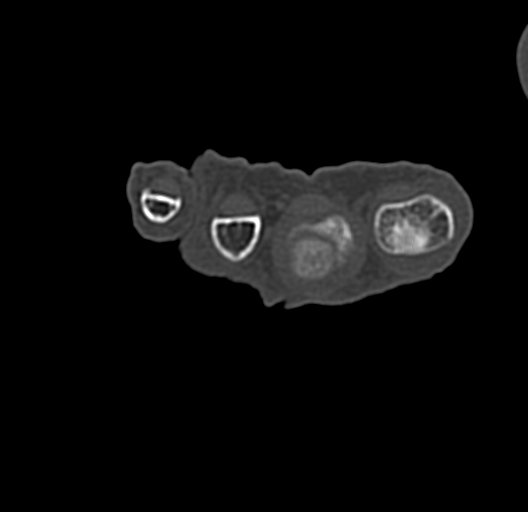

[Series 5: cor bone · coronal · 0.26mm/px · 1 of 90 slices shown]
[im 45/90  bone]
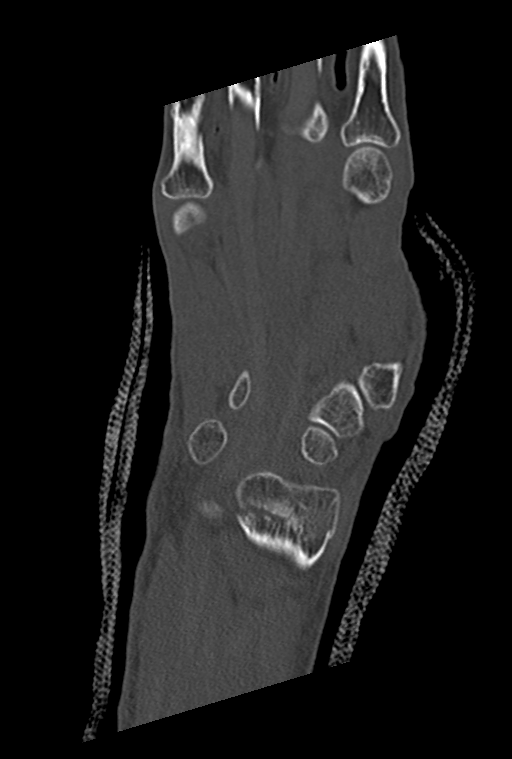

[Series 6: sag bone · sagittal · 0.19mm/px · 6 of 111 slices shown]
[im 19/111  bone]
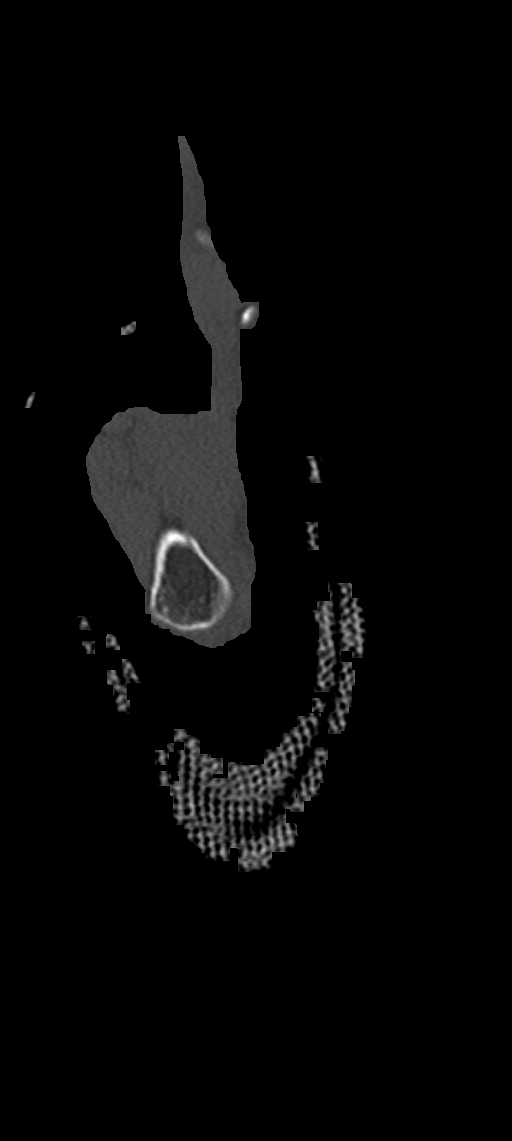
[im 37/111  bone]
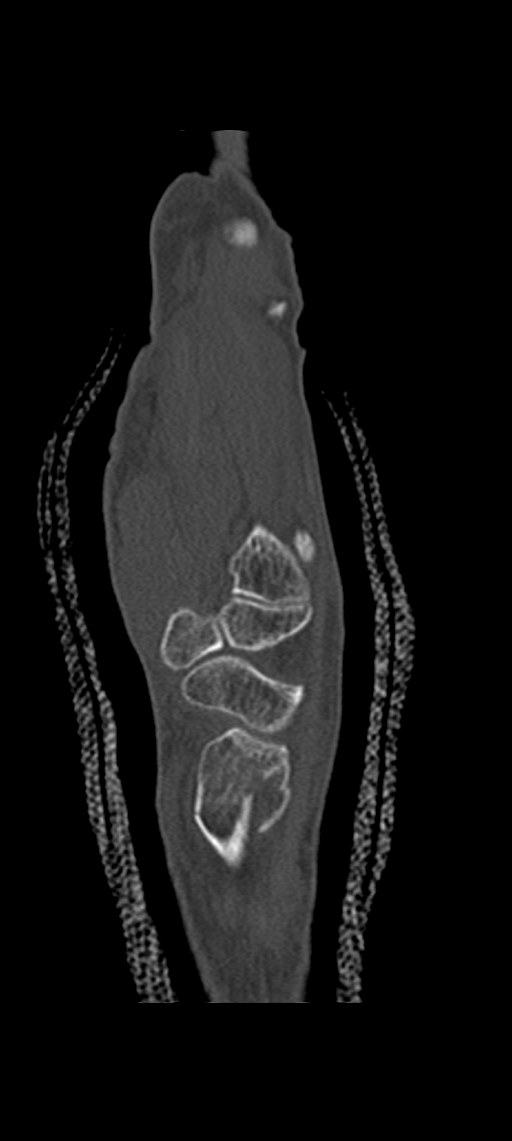
[im 41/111  soft-tissue]
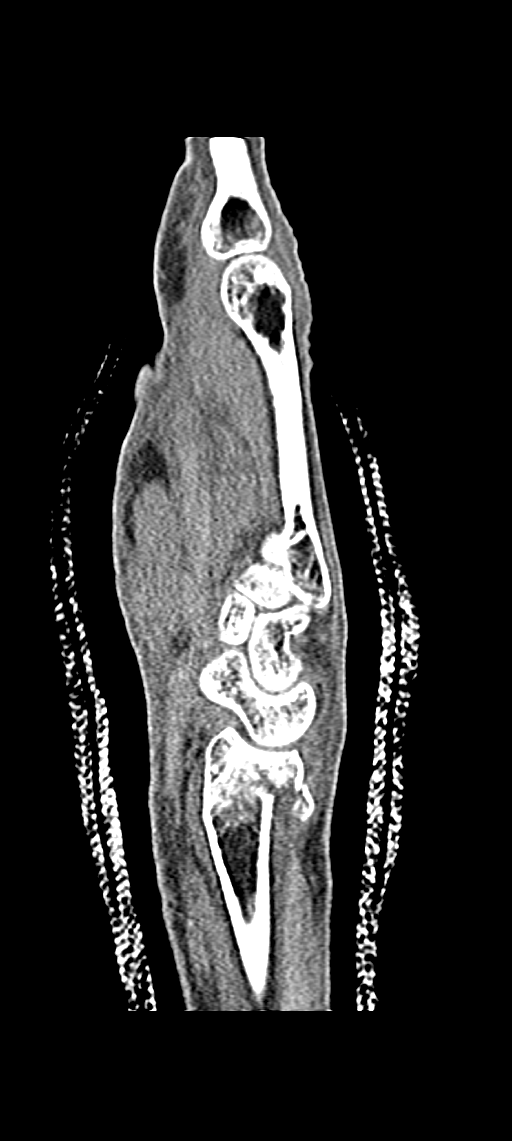
[im 56/111  bone]
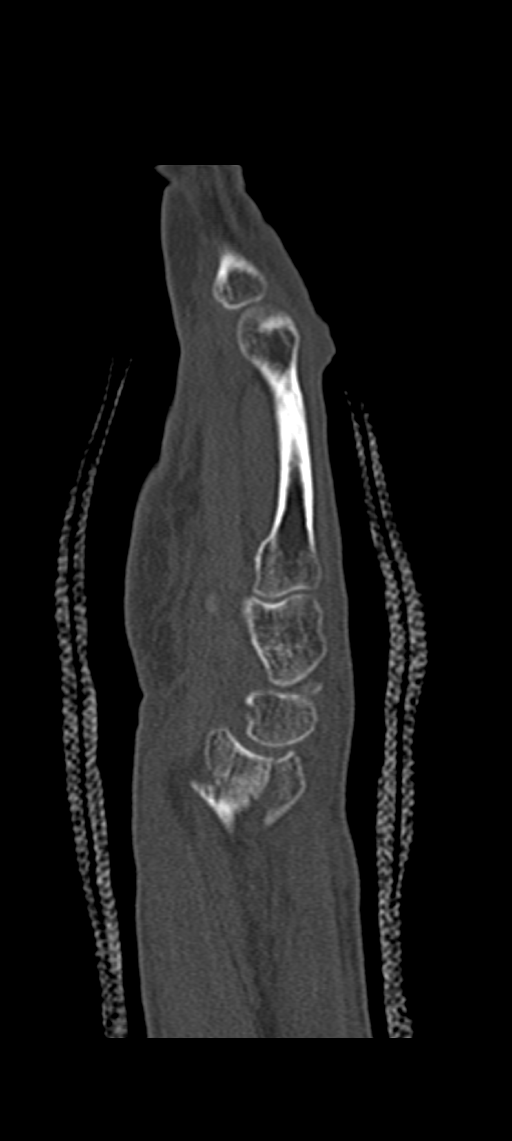
[im 74/111  bone]
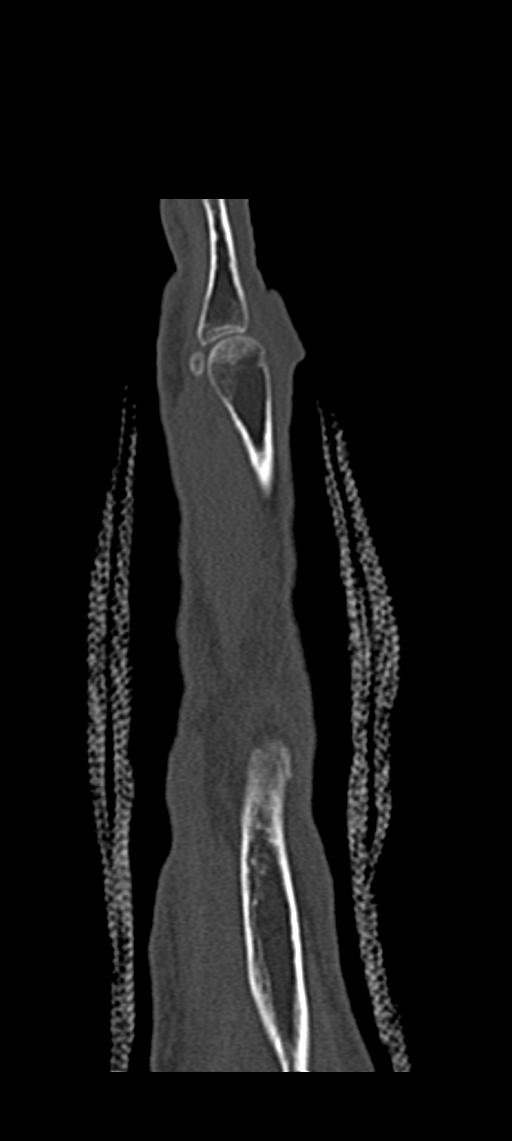
[im 92/111  bone]
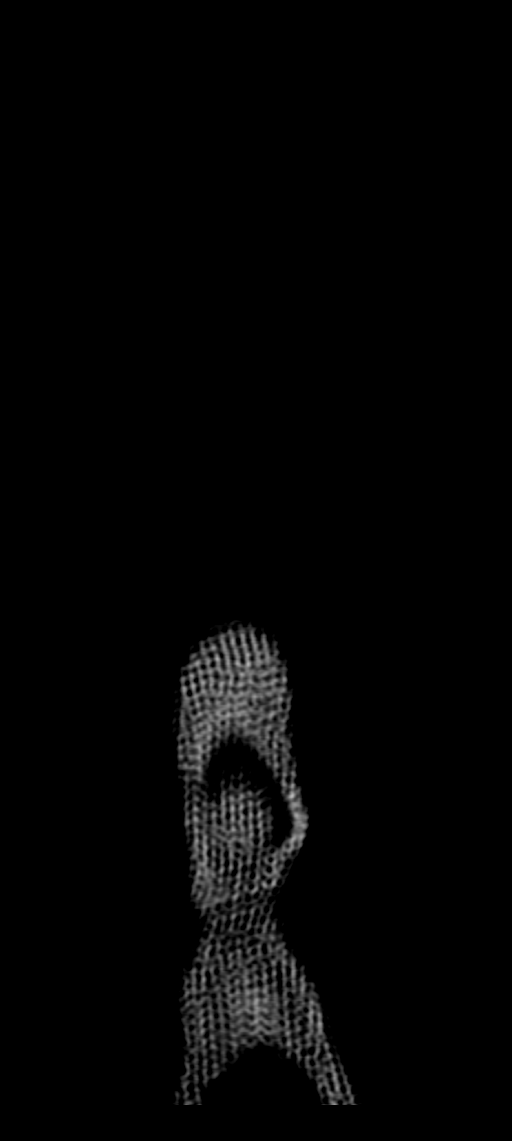

[11 of 35 positions shown; findings below may reference images not displayed]

FINDINGS: There is a dorsally impacted intra-articular Colles type fracture of
the distal radius. Subtle nondisplaced fracture involving the base
of the ulnar styloid. The radioulnar and radiocarpal joint spaces
are maintained. No carpal bone fractures are identified. The
intercarpal joint spaces are maintained. No metacarpal fractures.
IMPRESSION: 1. Dorsally impacted intra-articular Colles type fracture of the
distal radius.
2. Nondisplaced fracture involving the base of the ulnar styloid.
3. No carpal bone fractures are identified.

## 2023-06-10 NOTE — Progress Notes (Signed)
 Follow-Up Visit   Subjective  Deborah Harrison is a 78 y.o. female who presents for the following: Skin Cancer Screening and Full Body Skin Exam hx of isks Patient reports a spot at left ankle, left side of face, and bump at left eyebrow   The patient presents for Total-Body Skin Exam (TBSE) for skin cancer screening and mole check. The patient has spots, moles and lesions to be evaluated, some may be new or changing and the patient may have concern these could be cancer.    The following portions of the chart were reviewed this encounter and updated as appropriate: medications, allergies, medical history  Review of Systems:  No other skin or systemic complaints except as noted in HPI or Assessment and Plan.  Objective  Well appearing patient in no apparent distress; mood and affect are within normal limits.  A full examination was performed including scalp, head, eyes, ears, nose, lips, neck, chest, axillae, abdomen, back, buttocks, bilateral upper extremities, bilateral lower extremities, hands, feet, fingers, toes, fingernails, and toenails. All findings within normal limits unless otherwise noted below.   Relevant physical exam findings are noted in the Assessment and Plan.  Right Upper Eyelid x 1, right neck x 1, right abdomen x 1 (3) Erythematous stuck-on, waxy papule  Assessment & Plan   SKIN CANCER SCREENING PERFORMED TODAY.  ACTINIC DAMAGE - Chronic condition, secondary to cumulative UV/sun exposure - diffuse scaly erythematous macules with underlying dyspigmentation - Recommend daily broad spectrum sunscreen SPF 30+ to sun-exposed areas, reapply every 2 hours as needed.  - Staying in the shade or wearing long sleeves, sun glasses (UVA+UVB protection) and wide brim hats (4-inch brim around the entire circumference of the hat) are also recommended for sun protection.  - Call for new or changing lesions.  LENTIGINES, SEBORRHEIC KERATOSES, HEMANGIOMAS - Benign normal skin  lesions - Sk at left ankle, L malar cheek - Benign-appearing - Call for any changes  Lentigo vs SK right medial cheek  Exam 1.3 x 1.5 cm waxy tan speckled patch    Benign-appearing.  Stable. Observation.  Call clinic for new or changing lesions.  Recommend daily use of broad spectrum spf 30+ sunscreen to sun-exposed areas.    MELANOCYTIC NEVI - Tan-brown and/or pink-flesh-colored symmetric macules and papules - Benign appearing on exam today - Observation - Call clinic for new or changing moles - Recommend daily use of broad spectrum spf 30+ sunscreen to sun-exposed areas.   Sebaceous Hyperplasia vs nevus Exam: Right nasal ala- 3.5 mm pink yellow papule   - Benign appearing - Observe  EPIDERMAL INCLUSION CYST Exam:  Indistinct small subcutaneous nodule at left lateral eyebrow  Benign-appearing. Exam most consistent with an epidermal inclusion cyst. Discussed that a cyst is a benign growth that can grow over time and sometimes get irritated or inflamed. Recommend observation if it is not bothersome. Discussed option of surgical excision to remove it if it is growing, symptomatic, or other changes noted. Please call for new or changing lesions so they can be evaluated.   History of Squamous Cell Carcinoma of the Skin Patient reports Left lower leg 7 years ago - No evidence of recurrence today - Recommend regular full body skin exams - Recommend daily broad spectrum sunscreen SPF 30+ to sun-exposed areas, reapply every 2 hours as needed.  - Call if any new or changing lesions are noted between office visits    INFLAMED SEBORRHEIC KERATOSIS (3) Right Upper Eyelid x 1, right neck x 1,  right abdomen x 1 (3) Symptomatic, irritating, patient would like treated.  Destruction of lesion - Right Upper Eyelid x 1, right neck x 1, right abdomen x 1 (3)  Destruction method: cryotherapy   Informed consent: discussed and consent obtained   Timeout:  patient name, date of birth,  surgical site, and procedure verified Lesion destroyed using liquid nitrogen: Yes   Region frozen until ice ball extended beyond lesion: Yes   Outcome: patient tolerated procedure well with no complications   Post-procedure details: wound care instructions given   Additional details:  Prior to procedure, discussed risks of blister formation, small wound, skin dyspigmentation, or rare scar following cryotherapy. Recommend Vaseline ointment to treated areas while healing.  Return in about 1 year (around 06/09/2024) for TBSE.  I, Eleanor Blush, CMA, am acting as scribe for Rexene Rattler, MD.   Documentation: I have reviewed the above documentation for accuracy and completeness, and I agree with the above.  Rexene Rattler, MD

## 2023-06-10 NOTE — Patient Instructions (Addendum)

## 2023-11-28 ENCOUNTER — Inpatient Hospital Stay

## 2023-11-28 ENCOUNTER — Inpatient Hospital Stay
Admission: EM | Admit: 2023-11-28 | Discharge: 2023-11-29 | DRG: 069 | Disposition: A | Discharge status: Home / Self Care | Attending: Family Medicine | Admitting: Family Medicine

## 2023-11-28 ENCOUNTER — Other Ambulatory Visit: Payer: Self-pay

## 2023-11-28 ENCOUNTER — Emergency Department

## 2023-11-28 DIAGNOSIS — Z881 Allergy status to other antibiotic agents status: Secondary | ICD-10-CM | POA: Diagnosis not present

## 2023-11-28 DIAGNOSIS — E785 Hyperlipidemia, unspecified: Secondary | ICD-10-CM | POA: Diagnosis not present

## 2023-11-28 DIAGNOSIS — Z87891 Personal history of nicotine dependence: Secondary | ICD-10-CM

## 2023-11-28 DIAGNOSIS — R200 Anesthesia of skin: Secondary | ICD-10-CM

## 2023-11-28 DIAGNOSIS — I5032 Chronic diastolic (congestive) heart failure: Secondary | ICD-10-CM | POA: Diagnosis not present

## 2023-11-28 DIAGNOSIS — M810 Age-related osteoporosis without current pathological fracture: Secondary | ICD-10-CM | POA: Diagnosis present

## 2023-11-28 DIAGNOSIS — M5412 Radiculopathy, cervical region: Secondary | ICD-10-CM | POA: Diagnosis not present

## 2023-11-28 DIAGNOSIS — I110 Hypertensive heart disease with heart failure: Secondary | ICD-10-CM | POA: Diagnosis not present

## 2023-11-28 DIAGNOSIS — Z8249 Family history of ischemic heart disease and other diseases of the circulatory system: Secondary | ICD-10-CM | POA: Diagnosis not present

## 2023-11-28 DIAGNOSIS — R42 Dizziness and giddiness: Principal | ICD-10-CM

## 2023-11-28 DIAGNOSIS — Z833 Family history of diabetes mellitus: Secondary | ICD-10-CM | POA: Diagnosis not present

## 2023-11-28 DIAGNOSIS — I160 Hypertensive urgency: Secondary | ICD-10-CM | POA: Diagnosis present

## 2023-11-28 DIAGNOSIS — I5030 Unspecified diastolic (congestive) heart failure: Secondary | ICD-10-CM | POA: Insufficient documentation

## 2023-11-28 DIAGNOSIS — R55 Syncope and collapse: Secondary | ICD-10-CM | POA: Insufficient documentation

## 2023-11-28 DIAGNOSIS — I672 Cerebral atherosclerosis: Secondary | ICD-10-CM | POA: Diagnosis present

## 2023-11-28 DIAGNOSIS — Z85828 Personal history of other malignant neoplasm of skin: Secondary | ICD-10-CM

## 2023-11-28 DIAGNOSIS — G458 Other transient cerebral ischemic attacks and related syndromes: Secondary | ICD-10-CM | POA: Diagnosis not present

## 2023-11-28 DIAGNOSIS — G459 Transient cerebral ischemic attack, unspecified: Secondary | ICD-10-CM | POA: Diagnosis present

## 2023-11-28 DIAGNOSIS — Z880 Allergy status to penicillin: Secondary | ICD-10-CM | POA: Diagnosis not present

## 2023-11-28 DIAGNOSIS — Z7982 Long term (current) use of aspirin: Secondary | ICD-10-CM | POA: Insufficient documentation

## 2023-11-28 DIAGNOSIS — Z7902 Long term (current) use of antithrombotics/antiplatelets: Secondary | ICD-10-CM | POA: Insufficient documentation

## 2023-11-28 DIAGNOSIS — I159 Secondary hypertension, unspecified: Secondary | ICD-10-CM

## 2023-11-28 DIAGNOSIS — Z79899 Other long term (current) drug therapy: Secondary | ICD-10-CM | POA: Insufficient documentation

## 2023-11-28 LAB — URINE DRUG SCREEN, QUALITATIVE (ARMC ONLY)
Amphetamines, Ur Screen: NOT DETECTED
Barbiturates, Ur Screen: NOT DETECTED
Benzodiazepine, Ur Scrn: NOT DETECTED
Cannabinoid 50 Ng, Ur ~~LOC~~: NOT DETECTED
Cocaine Metabolite,Ur ~~LOC~~: NOT DETECTED
MDMA (Ecstasy)Ur Screen: NOT DETECTED
Methadone Scn, Ur: NOT DETECTED
Opiate, Ur Screen: NOT DETECTED
Phencyclidine (PCP) Ur S: NOT DETECTED
Tricyclic, Ur Screen: NOT DETECTED

## 2023-11-28 LAB — DIFFERENTIAL
Abs Immature Granulocytes: 0.01 10*3/uL (ref 0.00–0.07)
Basophils Absolute: 0 10*3/uL (ref 0.0–0.1)
Basophils Relative: 1 %
Eosinophils Absolute: 0.1 10*3/uL (ref 0.0–0.5)
Eosinophils Relative: 1 %
Immature Granulocytes: 0 %
Lymphocytes Relative: 31 %
Lymphs Abs: 1.8 10*3/uL (ref 0.7–4.0)
Monocytes Absolute: 0.6 10*3/uL (ref 0.1–1.0)
Monocytes Relative: 10 %
Neutro Abs: 3.3 10*3/uL (ref 1.7–7.7)
Neutrophils Relative %: 57 %

## 2023-11-28 LAB — COMPREHENSIVE METABOLIC PANEL WITH GFR
ALT: 14 U/L (ref 0–44)
AST: 22 U/L (ref 15–41)
Albumin: 3.9 g/dL (ref 3.5–5.0)
Alkaline Phosphatase: 82 U/L (ref 38–126)
Anion gap: 7 (ref 5–15)
BUN: 17 mg/dL (ref 8–23)
CO2: 28 mmol/L (ref 22–32)
Calcium: 9 mg/dL (ref 8.9–10.3)
Chloride: 107 mmol/L (ref 98–111)
Creatinine, Ser: 0.53 mg/dL (ref 0.44–1.00)
GFR, Estimated: >60 mL/min (ref >60)
Glucose, Bld: 100 mg/dL — ABNORMAL HIGH (ref 70–99)
Potassium: 4.1 mmol/L (ref 3.5–5.1)
Sodium: 142 mmol/L (ref 135–145)
Total Bilirubin: 1 mg/dL (ref 0.0–1.2)
Total Protein: 6.5 g/dL (ref 6.5–8.1)

## 2023-11-28 LAB — CBC
HCT: 41.5 % (ref 36.0–46.0)
HCT: 38 % (ref 36.0–46.0)
Hemoglobin: 13.5 g/dL (ref 12.0–15.0)
Hemoglobin: 12.8 g/dL (ref 12.0–15.0)
MCH: 32.3 pg (ref 26.0–34.0)
MCH: 32.7 pg (ref 26.0–34.0)
MCHC: 32.5 g/dL (ref 30.0–36.0)
MCHC: 33.7 g/dL (ref 30.0–36.0)
MCV: 99.3 fL (ref 80.0–100.0)
MCV: 97.2 fL (ref 80.0–100.0)
Platelets: 235 10*3/uL (ref 150–400)
Platelets: 226 10*3/uL (ref 150–400)
RBC: 4.18 MIL/uL (ref 3.87–5.11)
RBC: 3.91 MIL/uL (ref 3.87–5.11)
RDW: 12.4 % (ref 11.5–15.5)
RDW: 12.4 % (ref 11.5–15.5)
WBC: 5.7 10*3/uL (ref 4.0–10.5)
WBC: 6.7 10*3/uL (ref 4.0–10.5)
nRBC: 0 % (ref 0.0–0.2)
nRBC: 0 % (ref 0.0–0.2)

## 2023-11-28 LAB — PROTIME-INR
INR: 0.9 (ref 0.8–1.2)
Prothrombin Time: 12.8 s (ref 11.4–15.2)

## 2023-11-28 LAB — CREATININE, SERUM
Creatinine, Ser: 0.44 mg/dL (ref 0.44–1.00)
GFR, Estimated: >60 mL/min (ref >60)

## 2023-11-28 LAB — CBG MONITORING, ED: Glucose-Capillary: 90 mg/dL (ref 70–99)

## 2023-11-28 LAB — APTT: aPTT: 31 s (ref 24–36)

## 2023-11-28 LAB — HEMOGLOBIN A1C
Hgb A1c MFr Bld: 5.4 % (ref 4.8–5.6)
Mean Plasma Glucose: 108.28 mg/dL

## 2023-11-28 LAB — TROPONIN I (HIGH SENSITIVITY): Troponin I (High Sensitivity): 5 ng/L (ref <18)

## 2023-11-28 LAB — ETHANOL: Alcohol, Ethyl (B): <15 mg/dL (ref <15)

## 2023-11-28 MED ORDER — ASPIRIN 81 MG PO TBEC: 81.0000 mg | DELAYED_RELEASE_TABLET | Freq: Every day | ORAL | Status: DC

## 2023-11-28 MED ORDER — ACETAMINOPHEN 325 MG PO TABS: 650.0000 mg | ORAL_TABLET | ORAL | Status: DC | PRN

## 2023-11-28 MED ORDER — HEPARIN SODIUM (PORCINE) 5000 UNIT/ML IJ SOLN
5000.0000 [IU] | Freq: Three times a day (TID) | INTRAMUSCULAR | Status: DC
  Administered 2023-11-28 – 2023-11-29 (×2): 5000 [IU] via SUBCUTANEOUS
  Filled 2023-11-28: qty 1

## 2023-11-28 MED ORDER — SODIUM CHLORIDE 0.9% FLUSH: 3.0000 mL | Freq: Once | INTRAVENOUS | Status: DC

## 2023-11-28 MED ORDER — CLOPIDOGREL BISULFATE 75 MG PO TABS
75.0000 mg | ORAL_TABLET | Freq: Every day | ORAL | Status: DC
  Administered 2023-11-28 – 2023-11-29 (×2): 75 mg via ORAL
  Filled 2023-11-28 (×2): qty 1

## 2023-11-28 MED ORDER — ACETAMINOPHEN 650 MG RE SUPP: 650.0000 mg | RECTAL | Status: DC | PRN

## 2023-11-28 MED ORDER — ASPIRIN 81 MG PO CHEW
324.0000 mg | CHEWABLE_TABLET | Freq: Once | ORAL | Status: AC
  Administered 2023-11-28: 324 mg via ORAL
  Filled 2023-11-28: qty 4

## 2023-11-28 MED ORDER — ACETAMINOPHEN 160 MG/5ML PO SOLN: 650.0000 mg | ORAL | Status: DC | PRN

## 2023-11-28 MED ORDER — IOHEXOL 350 MG/ML SOLN
75.0000 mL | Freq: Once | INTRAVENOUS | Status: AC | PRN
  Administered 2023-11-28: 75 mL via INTRAVENOUS

## 2023-11-28 MED ORDER — ASPIRIN 81 MG PO TBEC
81.0000 mg | DELAYED_RELEASE_TABLET | Freq: Every day | ORAL | Status: DC
  Administered 2023-11-29: 81 mg via ORAL
  Filled 2023-11-28: qty 1

## 2023-11-28 MED ORDER — SENNOSIDES-DOCUSATE SODIUM 8.6-50 MG PO TABS: 1.0000 | ORAL_TABLET | Freq: Every evening | ORAL | Status: DC | PRN

## 2023-11-28 MED ORDER — STROKE: EARLY STAGES OF RECOVERY BOOK: Freq: Once | Status: AC

## 2023-11-28 MED ORDER — SODIUM CHLORIDE 0.9 % IV SOLN
INTRAVENOUS | Status: AC
Start: 2023-11-28 — End: 2023-11-29

## 2023-11-28 NOTE — ED Notes (Signed)
 Pt back from CT

## 2023-11-28 NOTE — ED Provider Notes (Signed)
 [facility] Provider Note    Event Date/Time   First MD Initiated Contact with Patient 11/28/23 [id number]     (approximate)   History   Dizziness   HPI  Deborah Harrison is a 78 y.o. female past medical history significant for osteoporosis, hyperlipidemia, squamous cell skin cancer, presents to the emergency department with dizziness.  Patient states that she has had intermittent episodes of dizziness and numbness sensation over the past couple of weeks.  States that she will have tingling sensation to her left face and arm and has had some blurred vision in the left eye and felt dizzy.  States that this lasted for approximately 10 minutes and then resolved on its own.  States that this has happened approximately 3 times over the past couple of weeks.  She initially just felt like she had slept on her arm or neck wrong.  Today states that it lasted longer and was not improving quickly which brought her to the emergency department.  Denies any history of CVA and does not take any daily medications.  No falls or head trauma.     Physical Exam   Triage Vital Signs: ED Triage Vitals [11/28/23 0832]  Encounter Vitals Group     BP (!) 171/102     Girls Systolic BP Percentile      Girls Diastolic BP Percentile      Boys Systolic BP Percentile      Boys Diastolic BP Percentile      Pulse Rate 70     Resp 20     Temp 98.5 F (36.9 C)     Temp Source Oral     SpO2 100 %     Weight 110 lb 3.7 oz (50 kg)     Height 5' 5 (1.651 m)     Head Circumference      Peak Flow      Pain Score 0     Pain Loc      Pain Education      Exclude from Growth Chart     Most recent vital signs: Vitals:   11/28/23 1319 11/28/23 1445  BP:  (!) 178/103  Pulse:  65  Resp:  17  Temp: 98.6 F (37 C) 98.3 F (36.8 C)  SpO2:  100%    Physical Exam Constitutional:      Appearance: She is well-developed.  HENT:     Head: Atraumatic.   Eyes:     Conjunctiva/sclera: Conjunctivae  normal.    Cardiovascular:     Rate and Rhythm: Regular rhythm.  Pulmonary:     Effort: No respiratory distress.  Abdominal:     General: There is no distension.     Tenderness: There is no abdominal tenderness.   Musculoskeletal:        General: Normal range of motion.     Cervical back: Normal range of motion.   Skin:    General: Skin is warm.   Neurological:     Mental Status: She is alert. Mental status is at baseline.     GCS: GCS eye subscore is 4. GCS verbal subscore is 5. GCS motor subscore is 6.     Cranial Nerves: Cranial nerves 2-12 are intact.     Sensory: Sensation is intact.     Motor: Motor function is intact.     Coordination: Coordination is intact.     Gait: Gait is intact.     IMPRESSION / MDM / ASSESSMENT AND  PLAN / ED COURSE  I reviewed the triage vital signs and the nursing notes.  Differential diagnosis including TIA, partial seizure, CVA, anemia, electrolyte abnormality, dehydration, muscle spasm  EKG  I, Clotilda Punter, the attending physician, personally viewed and interpreted this ECG.   Rate: Normal  Rhythm: Normal sinus  Axis: Normal  Intervals: Normal  ST&T Change: None flattening of T waves to lateral leads and inverted to lead III.  No tachycardic or bradycardic dysrhythmias while on cardiac telemetry.  RADIOLOGY I independently reviewed imaging, my interpretation of imaging: Ct head w/o acute findings.   LABS (all labs ordered are listed, but only abnormal results are displayed) Labs interpreted as -    Labs Reviewed  COMPREHENSIVE METABOLIC PANEL WITH GFR - Abnormal; Notable for the following components:      Result Value   Glucose, Bld 100 (*)    All other components within normal limits  PROTIME-INR  APTT  CBC  DIFFERENTIAL  ETHANOL  CREATININE, SERUM  URINE DRUG SCREEN, QUALITATIVE ([facility] ONLY)  CBC  HEMOGLOBIN A1C  CBG MONITORING, ED  I-STAT CREATININE, ED  TROPONIN I (HIGH SENSITIVITY)     MDM  No  significant electrolyte abnormality.  No leukocytosis.  No anemia.  Creatinine at baseline.  CT scan of the head with no findings of acute intracranial pathology.  Patient is back to her baseline.  Clinical picture concerning for possible TIA.  Consulted hospitalist for admission further workup.  Back to her baseline.  Given aspirin.     PROCEDURES:  Critical Care performed: No  Procedures  Patient's presentation is most consistent with acute presentation with potential threat to life or bodily function.   MEDICATIONS ORDERED IN ED: Medications  sodium chloride  flush (NS) 0.9 % injection 3 mL ( Intravenous Canceled Entry 11/28/23 0837)   stroke: early stages of recovery book (has no administration in time range)  0.9 %  sodium chloride  infusion ( Intravenous New Bag/Given 11/28/23 1318)  acetaminophen  (TYLENOL ) tablet 650 mg (has no administration in time range)    Or  acetaminophen  (TYLENOL ) 160 MG/5ML solution 650 mg (has no administration in time range)    Or  acetaminophen  (TYLENOL ) suppository 650 mg (has no administration in time range)  senna-docusate (Senokot-S) tablet 1 tablet (has no administration in time range)  heparin injection 5,000 Units (5,000 Units Subcutaneous Given 11/28/23 1309)  aspirin chewable tablet 324 mg (324 mg Oral Given 11/28/23 1112)    FINAL CLINICAL IMPRESSION(S) / ED DIAGNOSES   Final diagnoses:  Dizziness  TIA (transient ischemic attack)  Numbness  Secondary hypertension     Rx / DC Orders   ED Discharge Orders     None        Note:  This document was prepared using Dragon voice recognition software and may include unintentional dictation errors.   Punter Clotilda, MD 11/28/23 [phone]

## 2023-11-28 NOTE — Plan of Care (Signed)
  Problem: Education: Goal: Knowledge of disease or condition will improve Outcome: Progressing   Problem: Ischemic Stroke/TIA Tissue Perfusion: Goal: Complications of ischemic stroke/TIA will be minimized Outcome: Progressing   Problem: Coping: Goal: Will verbalize positive feelings about self Outcome: Progressing   Problem: Health Behavior/Discharge Planning: Goal: Ability to manage health-related needs will improve Outcome: Progressing   Problem: Self-Care: Goal: Ability to participate in self-care as condition permits will improve Outcome: Progressing   Problem: Nutrition: Goal: Risk of aspiration will decrease Outcome: Progressing   Problem: Education: Goal: Knowledge of General Education information will improve Description: Including pain rating scale, medication(s)/side effects and non-pharmacologic comfort measures Outcome: Progressing   Problem: Health Behavior/Discharge Planning: Goal: Ability to manage health-related needs will improve Outcome: Progressing   Problem: Activity: Goal: Risk for activity intolerance will decrease Outcome: Progressing   Problem: Nutrition: Goal: Adequate nutrition will be maintained Outcome: Progressing   Problem: Coping: Goal: Level of anxiety will decrease Outcome: Progressing   Problem: Elimination: Goal: Will not experience complications related to bowel motility Outcome: Progressing   Problem: Pain Managment: Goal: General experience of comfort will improve and/or be controlled Outcome: Progressing   Problem: Safety: Goal: Ability to remain free from injury will improve Outcome: Progressing   Problem: Skin Integrity: Goal: Risk for impaired skin integrity will decrease Outcome: Progressing   Problem: Education: Goal: Knowledge of General Education information will improve Description: Including pain rating scale, medication(s)/side effects and non-pharmacologic comfort measures Outcome: Progressing

## 2023-11-28 NOTE — Consult Note (Signed)
 NEUROLOGY CONSULT NOTE   Date of service: November 28, 2023 Patient Name: Deborah Harrison MRN:  [id number] DOB:  1945-09-24 Chief Complaint: Left face and arm numbness Requesting Provider: Debby Camila LABOR, MD  History of Present Illness  Deborah Harrison is a 78 y.o. female with a PMHJx of osteoporosis, HLD and squamous cell skin cancer who presented to the ED from the [facility] this morning with dizziness and her 5th episode in 3 weeks of transient left facial and arm numbness with burning paresthesia. All of her episodes have been similar, except for a concomitant feeling of presyncope with episodes 2 and 5 (5th one today). Additionally, the one she experienced today felt the most intense of all 5 and also included LLE numbness (but without the burning paresthesias that affected her face and arm). Other than difficulty using her left hand with today's episode, she has had no weakness in association with the spells, all of which have lasted 10-15 minutes prior to spontaneously resolving. Also with no vision changes, headache, vertigo, nausea, CP, SOB, appendicular ataxia, gait ataxia, confusion, aphasia or facial droop. Today's spell started 5 minutes after waking up at 0700. After arriving to the ED, she reported that her symptoms were resolved but that she still felt dizzy. She was hypertensive in the ED with a SBP > 200, but states that she has no history of HTN     ROS  Comprehensive ROS performed and pertinent positives documented in HPI    Past History   Past Medical History:  Diagnosis Date   Allergy    seasonal   Cancer (HCC)    History of abnormal Pap smear    10 years ago.   Pneumonia     Past Surgical History:  Procedure Laterality Date   COLPOSCOPY     abnormal pap x 19yrs ago.   ORIF WRIST FRACTURE Right 07/03/2021   Procedure: OPEN REDUCTION INTERNAL FIXATION (ORIF) WRIST FRACTURE;  Surgeon: Kathlynn Sharper, MD;  Location: [facility] ORS;  Service: Orthopedics;  Laterality:  Right;    Family History: Family History  Problem Relation Age of Onset   Heart disease Mother    Heart disease Father        heart attack.   Diabetes Paternal Grandmother     Social History  reports that she has quit smoking. She does not have any smokeless tobacco history on file. She reports current alcohol use. She reports that she does not use drugs.  Allergies  Allergen Reactions   Penicillins Itching, Swelling and Rash    Severe   Erythromycin Other (See Comments)    GI Intolerance     Medications   Current Facility-Administered Medications:    [START ON 11/29/2023]  stroke: early stages of recovery book, , Does not apply, Once, Debby Camila LABOR, MD   0.9 %  sodium chloride  infusion, , Intravenous, Continuous, Debby Camila LABOR, MD, Last Rate: 40 mL/hr at 11/28/23 1318, New Bag at 11/28/23 1318   acetaminophen  (TYLENOL ) tablet 650 mg, 650 mg, Oral, Q4H PRN **OR** acetaminophen  (TYLENOL ) 160 MG/5ML solution 650 mg, 650 mg, Per Tube, Q4H PRN **OR** acetaminophen  (TYLENOL ) suppository 650 mg, 650 mg, Rectal, Q4H PRN, Debby Camila A, MD   heparin injection 5,000 Units, 5,000 Units, Subcutaneous, Q8H, Debby Camila A, MD, 5,000 Units at 11/28/23 1309   senna-docusate (Senokot-S) tablet 1 tablet, 1 tablet, Oral, QHS PRN, Debby Camila LABOR, MD   sodium chloride  flush (NS) 0.9 % injection 3 mL, 3 mL, Intravenous, Once,  Suzanne Kirsch, MD  Current Outpatient Medications:    acetaminophen  (TYLENOL ) 500 MG tablet, Take 1,000 mg by mouth every 6 (six) hours as needed., Disp: , Rfl:    Cholecalciferol (VITAMIN D-3 PO), Take 400 mg by mouth daily., Disp: , Rfl:    HAWTHORN EXTRACT PO, Take 14 drops by mouth daily., Disp: , Rfl:   Vitals   Vitals:   12/10/23 0930 2023/12/10 0931 10-Dec-2023 1100 12/10/23 1319  BP: (!) 195/84  (!) 207/96   Pulse: 61  61   Resp: 15  14   Temp:    98.6 F (37 C)  TempSrc:    Oral  SpO2: 96% 98% 99%   Weight:      Height:         Body mass index is 18.34 kg/m.   Physical Exam   Constitutional: Appears well-developed and well-nourished.  Psych: Affect appropriate to situation.  Eyes: No scleral injection.  HENT: No OP obstruction.  Head: Normocephalic.  Respiratory: Effort normal, non-labored breathing.    Neurologic Examination   Mental Status: Awake and alert. Fully oriented x 5. Thought content appropriate. Speech fluent without evidence of aphasia.  Able to follow all commands without difficulty. Good insight.  Cranial Nerves: II: Temporal visual fields intact with no extinction to DSS. PERRL. III,IV, VI: No ptosis. EOMI. No nystagmus. V: Temp sensation equal bilaterally VII: Smile symmetric VIII: Hearing intact to voice IX,X: No hypophonia or hoarseness XI: Symmetric XII: Midline tongue extension Motor: RUE: 5/5 LUE: 5/5 RLE: 5/5 LLE: 5/5 Normal muscle bulk and tone in the context of slim body habitus.  Sensory: Temp and FT intact x 4 proximally and distally. No extinction to DSS. Deep Tendon Reflexes: 2+ and symmetric bilateral biceps, brachioradialis and patellae Plantars: Right: downgoingLeft: downgoing Cerebellar: No ataxia with FNF bilaterally Gait: Deferred  Labs/Imaging/Neurodiagnostic studies   CBC:  Recent Labs  Lab 12/10/2023 0841 12/10/23 1311  WBC 5.7 6.7  NEUTROABS 3.3  --   HGB 13.5 12.8  HCT 41.5 38.0  MCV 99.3 97.2  PLT 235 226   Basic Metabolic Panel:  Lab Results  Component Value Date   NA 142 12/10/2023   K 4.1 2023-12-10   CO2 28 2023/12/10   GLUCOSE 100 (H) 12-10-23   BUN 17 Dec 10, 2023   CREATININE 0.44 10-Dec-2023   CALCIUM 9.0 12/10/2023   GFRNONAA >60 2023-12-10   GFRAA >60 10/16/2011   Lipid Panel: No results found for: LDLCALC HgbA1c: No results found for: HGBA1C Urine Drug Screen: No results found for: LABOPIA, COCAINSCRNUR, LABBENZ, AMPHETMU, THCU, LABBARB  Alcohol Level     Component Value Date/Time   [facility] <15 December 10, 2023  0841   INR  Lab Results  Component Value Date   INR 0.9 12/10/23   APTT  Lab Results  Component Value Date   APTT 31 12-10-2023     ASSESSMENT  78 y.o. female with a PMHJx of osteoporosis, HLD and squamous cell skin cancer who presented to the ED from the [facility] this morning with dizziness and her 5th episode in 3 weeks of transient left facial and arm numbness with burning paresthesia. All of her episodes have been similar, except for a concomitant feeling of presyncope with episodes 2 and 5 (5th one today). Additionally, the one she experienced today felt the most intense of all 5 and also included LLE numbness (but without the burning paresthesias that affected her face and arm). Other than difficulty using her left hand with today's episode, she  has had no weakness in association with the spells, all of which have lasted 10-15 minutes prior to spontaneously resolving. Also with no vision changes, headache, vertigo, nausea, CP, SOB, appendicular ataxia, gait ataxia, confusion, aphasia or facial droop. Today's spell started 5 minutes after waking up at 0700. After arriving to the ED, she reported that her symptoms were resolved but that she still felt dizzy. She was hypertensive in the ED with a SBP > 200, but states that she has no history of HTN  - Neurological exam is nonfocal.  - CT head: Unremarkable CT appearance of the brain for age.  - MRI brain: No acute intracranial abnormality. Mild chronic microvascular ischemic changes. - EKG: Normal sinus rhythm; T wave abnormality, consider inferolateral ischemia. When compared with ECG of 03-Jul-2021 11:40, inverted T waves have replaced nonspecific T wave abnormality in Lateral leads - Labs:  - Normal Na, K, Ca, BUN, Cr and LFTs.  - CBC normal.  - UDS negative - Impression: DDx for the patient's recurrent spells of left sided sensory disturbance includes cervical radiculopathy with atypical radiation of symptoms to the face,  versus capsular warning syndrome    RECOMMENDATIONS  - B12 level - MRI of cervical spine - CTA of head and neck - TTE - Cardiac telemetry - DAPT with ASA and Plavix x 21 days followed by ASA monotherapy - HgbA1c, fasting lipid panel - PT consult, OT consult, Speech consult - Modified permissive HTN protocol given advanced age: Treat SBP if > 180. After 24 hours, normalize by 20% per day to SBP goal of 120-140. - Low-dose atorvastatin of 20 mg po every day given the patient's advanced age. Obtain baseline CK level - Frequent neuro checks - NPO until passes stroke swallow screen   ______________________________________________________________________    Bonney SHARK, Haadi Santellan, MD [facility]

## 2023-11-28 NOTE — Progress Notes (Signed)
 SLP Cancellation Note  Patient Details Name: Hildegarde Dunaway MRN: [id number] DOB: 16-Oct-1945   Cancelled treatment:       Reason Eval/Treat Not Completed: SLP screened, no needs identified, will sign off   Sadiq Mccauley 11/28/2023, 3:09 PM

## 2023-11-28 NOTE — ED Triage Notes (Signed)
 Pt to ED via POV from [facility]. Pt rpeorts 3wks ago started to have a weird sensation in left eye area and left arm went numb. Pt reports symptoms resolved. Pt reports a couple days later happened again and got dizzy. Pt reports this morning started to have symptoms after waking. Pt reports woke up at 0700am. Pt reports symptoms onset 0705 and had dizziness and right arm tingling and difficulty using hand. Pt reports symptoms have resolved but still feels dizzy. No hx of HTN

## 2023-11-28 NOTE — ED Notes (Signed)
 Pt at MRI

## 2023-11-28 NOTE — H&P (Incomplete)
 History and Physical    Deborah Harrison FMW:[id number] DOB: 07/14/1945 DOA: 11/28/2023  PCP: Pcp, No  Patient coming from: from [facility] clinic  I have personally briefly reviewed patient's old medical records in [facility]  Chief Complaint:  noted right arm numbness shortly after waking.  HPI: Deborah Harrison is a 78 y.o. female with no significant medical history who presents to ED with right arm numbness shortly after waking this am around 7 am. Patient states around 3 weeks ago she began to have intermittent symptoms of  left arm and facial  numbness with associated periods of dizziness. She states did not seek evaluation as symptoms resolved on there own. However this am when she started having symptoms after waking she decided to present to [facility] clinic and was later referred to ED.    ED Course:  Patient on initial evaluation was note to have normal neuro exam, and CTH was noted to be negative. However patient was note to have elevated blood pressure. Of note patient does not have a diagnosis of elevated blood pressure.  Afeb bp 171/102-207/96, hr 70, rr 20, sat 100% on ra EKG: nsr inferior lat twave changes , more prominent on current EKG NA 143, K4.1, cr 107, cr 0.53,  wbcL 5.7, ,hgb 13.5, plt 235  ETOH <15 CTH: NAD Trop 5 Review of Systems: As per HPI otherwise 10 point review of systems negative.   Past Medical History:  Diagnosis Date   Allergy    seasonal   Cancer [facility])    History of abnormal Pap smear    10 years ago.   Pneumonia     Past Surgical History:  Procedure Laterality Date   COLPOSCOPY     abnormal pap x 66yrs ago.   ORIF WRIST FRACTURE Right 07/03/2021   Procedure: OPEN REDUCTION INTERNAL FIXATION (ORIF) WRIST FRACTURE;  Surgeon: Kathlynn Sharper, MD;  Location: [facility] ORS;  Service: Orthopedics;  Laterality: Right;     reports that she has quit smoking. She does not have any smokeless tobacco history on file. She reports current alcohol use. She reports that she  does not use drugs.  Allergies  Allergen Reactions   Penicillins Itching, Swelling and Rash    Severe   Erythromycin Other (See Comments)    GI Intolerance     Family History  Problem Relation Age of Onset   Heart disease Mother    Heart disease Father        heart attack.   Diabetes Paternal Grandmother    *** Prior to Admission medications   Medication Sig Start Date End Date Taking? Authorizing Provider  acetaminophen  (TYLENOL ) 500 MG tablet Take 1,000 mg by mouth every 6 (six) hours as needed.    [provider]  Cholecalciferol (VITAMIN D-3 PO) Take 400 mg by mouth daily.    [provider]  HAWTHORN EXTRACT PO Take 14 drops by mouth daily.    [provider]    Physical Exam: Vitals:   11/28/23 0832 11/28/23 0930 11/28/23 0931 11/28/23 1100  BP: (!) 171/102 (!) 195/84  (!) 207/96  Pulse: 70 61  61  Resp: 20 15  14   Temp: 98.5 F (36.9 C)     TempSrc: Oral     SpO2: 100% 96% 98% 99%  Weight: 50 kg     Height: 5' 5 (1.651 m)       Constitutional: NAD, calm, comfortable Vitals:   11/28/23 0832 11/28/23 0930 11/28/23 0931 11/28/23 1100  BP: ROLLEN)  171/102 (!) 195/84  (!) 207/96  Pulse: 70 61  61  Resp: 20 15  14   Temp: 98.5 F (36.9 C)     TempSrc: Oral     SpO2: 100% 96% 98% 99%  Weight: 50 kg     Height: 5' 5 (1.651 m)      Eyes: PERRL, lids and conjunctivae normal ENMT: Mucous membranes are moist. Posterior pharynx clear of any exudate or lesions.Normal dentition.  Neck: normal, supple, no masses, no thyromegaly Respiratory: clear to auscultation bilaterally, no wheezing, no crackles. Normal respiratory effort. No accessory muscle use.  Cardiovascular: Regular rate and rhythm, no murmurs / rubs / gallops. No extremity edema. 2+ pedal pulses. No carotid bruits.  Abdomen: no tenderness, no masses palpated. No hepatosplenomegaly. Bowel sounds positive.  Musculoskeletal: no clubbing / cyanosis. No joint deformity upper and lower  extremities. Good ROM, no contractures. Normal muscle tone.  Skin: no rashes, lesions, ulcers. No induration Neurologic: CN 2-12 grossly intact. Sensation intact, DTR normal. Strength 5/5 in all 4.  Psychiatric: Normal judgment and insight. Alert and oriented x 3. Normal mood.    Labs on Admission: I have personally reviewed following labs and imaging studies  CBC: Recent Labs  Lab 11/28/23 0841  WBC 5.7  NEUTROABS 3.3  HGB 13.5  HCT 41.5  MCV 99.3  PLT 235   Basic Metabolic Panel: Recent Labs  Lab 11/28/23 0841  NA 142  K 4.1  CL 107  CO2 28  GLUCOSE 100*  BUN 17  CREATININE 0.53  CALCIUM 9.0   GFR: Estimated Creatinine Clearance: 45 mL/min (by C-G formula based on SCr of 0.53 mg/dL). Liver Function Tests: Recent Labs  Lab 11/28/23 0841  AST 22  ALT 14  ALKPHOS 82  BILITOT 1.0  PROT 6.5  ALBUMIN 3.9   No results for input(s): LIPASE, AMYLASE in the last 168 hours. No results for input(s): AMMONIA in the last 168 hours. Coagulation Profile: Recent Labs  Lab 11/28/23 0841  INR 0.9   Cardiac Enzymes: No results for input(s): CKTOTAL, CKMB, CKMBINDEX, TROPONINI in the last 168 hours. BNP (last 3 results) No results for input(s): PROBNP in the last 8760 hours. HbA1C: No results for input(s): HGBA1C in the last 72 hours. CBG: Recent Labs  Lab 11/28/23 0840  GLUCAP 90   Lipid Profile: No results for input(s): CHOL, HDL, LDLCALC, TRIG, CHOLHDL, LDLDIRECT in the last 72 hours. Thyroid Function Tests: No results for input(s): TSH, T4TOTAL, FREET4, T3FREE, THYROIDAB in the last 72 hours. Anemia Panel: No results for input(s): VITAMINB12, FOLATE, FERRITIN, TIBC, IRON, RETICCTPCT in the last 72 hours. Urine analysis: No results found for: COLORURINE, APPEARANCEUR, LABSPEC, PHURINE, GLUCOSEU, HGBUR, BILIRUBINUR, KETONESUR, PROTEINUR, UROBILINOGEN, NITRITE,  LEUKOCYTESUR  Radiological Exams on Admission: CT HEAD WO CONTRAST Result Date: 11/28/2023 CLINICAL DATA:  Recent episodes of tingling involving the left periorbital region of both arms. Dizziness. EXAM: CT HEAD WITHOUT CONTRAST TECHNIQUE: Contiguous axial images were obtained from the base of the skull through the vertex without intravenous contrast. RADIATION DOSE REDUCTION: This exam was performed according to the departmental dose-optimization program which includes automated exposure control, adjustment of the mA and/or kV according to patient size and/or use of iterative reconstruction technique. COMPARISON:  None Available. FINDINGS: Brain: There is no evidence of an acute infarct, intracranial hemorrhage, mass, midline shift, or extra-axial fluid collection. Cerebral volume is within normal limits for age. The ventricles are normal in size. Vascular: Calcified atherosclerosis at the skull base. No hyperdense vessel.  Skull: No fracture or suspicious lesion. Sinuses/Orbits: Visualized paranasal sinuses and mastoid air cells are well aerated. Unremarkable included orbits. Other: None. IMPRESSION: Unremarkable CT appearance of the brain for age. Electronically Signed   By: Dasie Hamburg M.D.   On: 11/28/2023 09:44    EKG: Independently reviewed. See above  Assessment/Plan  TIA r/o CVA -right sided arm numbness  - CT head negative for acute finding , neuro exam no focal neuro deficits  -admit tia/cva r/o  -mri pending -asa given in ed , continue ASA 81  -neuro checks , SLP, PT/OT  -permissive hypertension per protocol -echo /carotids  per protocol  -neuro consult for further assistance   Uncontrolled Hypertension  - no prior hx of HTN  - labetolol iv prn per protocol     DVT prophylaxis: heparin Code Status: full/ as discussed per patient wishes in event of cardiac arrest  Family Communication: none at bedside Disposition Plan: patient  expected to be admitted greater than 2  midnights  Consults called: neurology  Admission status: progressive care   Deborah DELENA Ned MD [facility]   If 7PM-7AM, please contact night-coverage [email or web address] Password [facility]  11/28/2023, 12:05 PM

## 2023-11-28 NOTE — Progress Notes (Signed)
 PT Cancellation Note  Patient Details Name: Deborah Harrison MRN: [id number] DOB: September 07, 1945   Cancelled Treatment:    Reason Eval/Treat Not Completed: Patient at procedure or test/unavailable Patient at MRI. Will re-attempt at later time/date.   Deborah Harrison 11/28/2023, 2:26 PM

## 2023-11-28 NOTE — Progress Notes (Signed)
 Patient brought to floor from ED in wheelchair. No distress noted. No complaint voiced.Orientated to room , made comfortable and call bell. given . Husband took patient purse home.

## 2023-11-29 ENCOUNTER — Inpatient Hospital Stay
Admit: 2023-11-29 | Discharge: 2023-11-29 | Disposition: A | Discharge status: Home / Self Care | Attending: Internal Medicine | Admitting: Internal Medicine

## 2023-11-29 ENCOUNTER — Inpatient Hospital Stay

## 2023-11-29 DIAGNOSIS — I672 Cerebral atherosclerosis: Secondary | ICD-10-CM

## 2023-11-29 DIAGNOSIS — M5412 Radiculopathy, cervical region: Secondary | ICD-10-CM | POA: Diagnosis not present

## 2023-11-29 DIAGNOSIS — G458 Other transient cerebral ischemic attacks and related syndromes: Secondary | ICD-10-CM

## 2023-11-29 DIAGNOSIS — G459 Transient cerebral ischemic attack, unspecified: Secondary | ICD-10-CM | POA: Diagnosis not present

## 2023-11-29 LAB — ECHOCARDIOGRAM COMPLETE
AR max vel: 2.34 cm2
AV Peak grad: 9.2 mmHg
Ao pk vel: 1.52 m/s
Area-P 1/2: 4.12 cm2
Height: 65 in
S' Lateral: 2.3 cm
Weight: 1763.68 [oz_av]

## 2023-11-29 LAB — LIPID PANEL
Cholesterol: 233 mg/dL — ABNORMAL HIGH (ref 0–200)
HDL: 78 mg/dL (ref >40)
LDL Cholesterol: 141 mg/dL — ABNORMAL HIGH (ref 0–99)
Total CHOL/HDL Ratio: 3 ratio
Triglycerides: 72 mg/dL (ref <150)
VLDL: 14 mg/dL (ref 0–40)

## 2023-11-29 LAB — VITAMIN B12: Vitamin B-12: 410 pg/mL (ref 180–914)

## 2023-11-29 LAB — CK: Total CK: 68 U/L (ref 38–234)

## 2023-11-29 MED ORDER — ATORVASTATIN CALCIUM 40 MG PO TABS: 40.0000 mg | ORAL_TABLET | Freq: Every day | ORAL | 0 refills | Status: AC

## 2023-11-29 MED ORDER — ATORVASTATIN CALCIUM 20 MG PO TABS
40.0000 mg | ORAL_TABLET | Freq: Every day | ORAL | Status: DC
  Administered 2023-11-29: 40 mg via ORAL
  Filled 2023-11-29: qty 2

## 2023-11-29 MED ORDER — ASPIRIN 81 MG PO TBEC: 81.0000 mg | DELAYED_RELEASE_TABLET | Freq: Every day | ORAL | 0 refills | Status: AC

## 2023-11-29 MED ORDER — AMLODIPINE BESYLATE 5 MG PO TABS
5.0000 mg | ORAL_TABLET | Freq: Every day | ORAL | Status: DC
  Administered 2023-11-29: 5 mg via ORAL
  Filled 2023-11-29: qty 1

## 2023-11-29 MED ORDER — AMLODIPINE BESYLATE 5 MG PO TABS: 5.0000 mg | ORAL_TABLET | Freq: Every day | ORAL | 0 refills | Status: AC

## 2023-11-29 MED ORDER — CLOPIDOGREL BISULFATE 75 MG PO TABS: 75.0000 mg | ORAL_TABLET | Freq: Every day | ORAL | 2 refills | Status: AC

## 2023-11-29 NOTE — Progress Notes (Signed)
 NEUROLOGY CONSULT FOLLOW UP NOTE   Date of service: November 29, 2023 Patient Name: Deborah Harrison MRN:  [id number] DOB:  1946/01/25  Interval Hx/subjective  No complaints.   Vitals   Vitals:   11/28/23 2001 11/29/23 0038 11/29/23 0400 11/29/23 0809  BP: (!) 143/80 (!) 143/85 (!) 139/91 (!) 146/80  Pulse: 71 64 69 75  Resp: 18  16 18   Temp: 98.2 F (36.8 C) 97.7 F (36.5 C) 98.6 F (37 C) 98.9 F (37.2 C)  TempSrc:    Oral  SpO2: 97% 98% 96% 98%  Weight:      Height:         Body mass index is 18.34 kg/m.  Physical Exam   Constitutional: Appears well-developed and well-nourished.  Psych: Affect appropriate to situation.  Eyes: No scleral injection.  HENT: No OP obstruction.  Head: Normocephalic.  Respiratory: Effort normal, non-labored breathing.     Neurologic Examination    Mental Status: Awake and alert. Fully oriented. Thought content appropriate. Speech fluent without evidence of aphasia.  Able to follow all commands without difficulty. Good insight.  Cranial Nerves: II, III,IV, VI: Fixates and tracks normally VII: Smile symmetric VIII: Hearing intact to voice IX,X: No hypophonia or hoarseness XI: Symmetric XII: No lingual dysarthria Motor: No abnormal movements noted.  Cerebellar: No ataxia with spontaneous upper extremity movements Gait: Deferred  Medications  Current Facility-Administered Medications:    0.9 %  sodium chloride  infusion, , Intravenous, Continuous, Debby Camila LABOR, MD, Paused at 11/28/23 2008   acetaminophen  (TYLENOL ) tablet 650 mg, 650 mg, Oral, Q4H PRN **OR** acetaminophen  (TYLENOL ) 160 MG/5ML solution 650 mg, 650 mg, Per Tube, Q4H PRN **OR** acetaminophen  (TYLENOL ) suppository 650 mg, 650 mg, Rectal, Q4H PRN, Debby Camila LABOR, MD   aspirin EC tablet 81 mg, 81 mg, Oral, Daily, Barrie Sigmund, MD, 81 mg at 11/29/23 0919   atorvastatin (LIPITOR) tablet 40 mg, 40 mg, Oral, Daily, Koleton Duchemin, MD, 40 mg at 11/29/23 0919   clopidogrel  (PLAVIX) tablet 75 mg, 75 mg, Oral, Daily, Jennalyn Cawley, MD, 75 mg at 11/29/23 0919   heparin injection 5,000 Units, 5,000 Units, Subcutaneous, Q8H, Debby Camila A, MD, 5,000 Units at 11/28/23 1309   senna-docusate (Senokot-S) tablet 1 tablet, 1 tablet, Oral, QHS PRN, Debby Camila LABOR, MD   sodium chloride  flush (NS) 0.9 % injection 3 mL, 3 mL, Intravenous, Once, Suzanne Kirsch, MD  Labs and Diagnostic Imaging   CBC:  Recent Labs  Lab 11/28/23 0841 11/28/23 1311  WBC 5.7 6.7  NEUTROABS 3.3  --   HGB 13.5 12.8  HCT 41.5 38.0  MCV 99.3 97.2  PLT 235 226    Basic Metabolic Panel:  Lab Results  Component Value Date   NA 142 11/28/2023   K 4.1 11/28/2023   CO2 28 11/28/2023   GLUCOSE 100 (H) 11/28/2023   BUN 17 11/28/2023   CREATININE 0.44 11/28/2023   CALCIUM 9.0 11/28/2023   GFRNONAA >60 11/28/2023   GFRAA >60 10/16/2011   Lipid Panel:  Lab Results  Component Value Date   LDLCALC 141 (H) 11/29/2023   HgbA1c:  Lab Results  Component Value Date   HGBA1C 5.4 11/28/2023   Urine Drug Screen:     Component Value Date/Time   LABOPIA NONE DETECTED 11/28/2023 1311   COCAINSCRNUR NONE DETECTED 11/28/2023 1311   LABBENZ NONE DETECTED 11/28/2023 1311   AMPHETMU NONE DETECTED 11/28/2023 1311   THCU NONE DETECTED 11/28/2023 1311   LABBARB NONE DETECTED 11/28/2023 1311  Alcohol Level     Component Value Date/Time   [facility] <15 11/28/2023 0841   INR  Lab Results  Component Value Date   INR 0.9 11/28/2023   APTT  Lab Results  Component Value Date   APTT 31 11/28/2023     Assessment  78 y.o. female with a PMHJx of osteoporosis, HLD and squamous cell skin cancer who presented to the ED from the [facility] this morning with dizziness and her 5th episode in 3 weeks of transient left facial and arm numbness with burning paresthesia. All of her episodes have been similar, except for a concomitant feeling of presyncope with episodes 2 and 5 (5th one today).  Additionally, the one she experienced today felt the most intense of all 5 and also included LLE numbness (but without the burning paresthesias that affected her face and arm). Other than difficulty using her left hand with today's episode, she has had no weakness in association with the spells, all of which have lasted 10-15 minutes prior to spontaneously resolving. Also with no vision changes, headache, vertigo, nausea, CP, SOB, appendicular ataxia, gait ataxia, confusion, aphasia or facial droop. Today's spell started 5 minutes after waking up at 0700. After arriving to the ED, she reported that her symptoms were resolved but that she still felt dizzy. She was hypertensive in the ED with a SBP > 200, but states that she has no history of HTN  - Neurological exam is nonfocal.  - CT head: Unremarkable CT appearance of the brain for age.  - MRI brain: No acute intracranial abnormality. Mild chronic microvascular ischemic changes. - CTA of head and neck: No emergent large vessel occlusion. 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 The PCAs are fetal type bilaterally and patent. 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 Moderate stenosis of the right paraclinoid ICA. - EKG: Normal sinus rhythm; T wave abnormality, consider inferolateral ischemia. When compared with ECG of 03-Jul-2021 11:40, inverted T waves have replaced nonspecific T wave abnormality in Lateral leads - Labs:  - Normal Na, K, Ca, BUN, Cr and LFTs.  - CBC normal.  - UDS negative - HgbA1c is normal - Total cholesterol and LDL are elevated - B12 level is pending - CK level is normal - Impression: DDx for the patient's recurrent spells of left sided sensory disturbance includes cervical radiculopathy with atypical radiation of symptoms to the face, versus capsular warning syndrome      RECOMMENDATIONS  - MRI of cervical spine (ordered) - TTE  (ordered) - Cardiac telemetry - Due to significant intracranial atherosclerosis, increasing her atorvastatin dose to 40 mg po every day. Additionally, I have changed her antiplatelet recommendation to DAPT indefinitely.  - PT consult, OT consult, Speech consult - Long term SBP goal of 120-140, but would not aggressively treat inpatient if SBPs are in the 140-160 range due to her small-caliber posterior circulation, as seen on CTA, in conjunction with her presyncopal spells - Frequent neuro checks - Stay well-hydrated    ______________________________________________________________________   Bonney SHARK, Christee Mervine, MD [facility]

## 2023-11-29 NOTE — Progress Notes (Signed)
 PT Cancellation Note  Patient Details Name: Deborah Harrison MRN: [id number] DOB: November 03, 1945   Cancelled Treatment:    Reason Eval/Treat Not Completed: PT screened, no needs identified, will sign off.  Per OT pt independent with ambulation without an AD and is currently at her baseline level of function with no skilled PT needs.  Will complete PT orders at this time but will reassess pt pending a change in status upon receipt of new PT orders.    CHARM Glendia Bertin PT, DPT 11/29/23, 10:09 AM

## 2023-11-29 NOTE — Evaluation (Signed)
 Occupational Therapy Evaluation Patient Details Name: Deborah Harrison MRN: [id number] DOB: [birth year] Today's Date: 11/29/2023   History of Present Illness   78 y.o. female past medical history significant for osteoporosis, hyperlipidemia, squamous cell skin cancer, presents to the emergency department with dizziness.  Patient states that she has had intermittent episodes of dizziness and numbness sensation over the past couple of weeks.  States that she will have tingling sensation to her left face and arm and has had some blurred vision in the left eye and felt dizzy.     Clinical Impressions Upon entering the room, pt supine in bed and agreeable to OT evaluation. Pt reports being Ind and living with husband at [facility]. She is very active in community, exercises, and still drives. Pt reports numbness has resolved at time of evaluation with no difficulty with dexterity or strength during functional tasks. Pt ambulates without use of RW 200' with head turns and picks up items from floor without issue. Pt returning back to room to sit in recliner chair for breakfast. Pt does not need skilled OT intervention at this time and pt agrees. OT to complete orders.      If plan is discharge home, recommend the following:         Functional Status Assessment   Patient has not had a recent decline in their functional status     Equipment Recommendations   None recommended by OT      Precautions/Restrictions   Precautions Precautions: None     Mobility Bed Mobility Overal bed mobility: Independent                  Transfers Overall transfer level: Independent Equipment used: None                      Balance Overall balance assessment: Independent                                         ADL either performed or assessed with clinical judgement   ADL Overall ADL's : Independent                                              Vision Patient Visual Report: No change from baseline              Pertinent Vitals/Pain Pain Assessment Pain Assessment: No/denies pain     Extremity/Trunk Assessment Upper Extremity Assessment Upper Extremity Assessment: Overall WFL for tasks assessed   Lower Extremity Assessment Lower Extremity Assessment: Overall WFL for tasks assessed       Communication Communication Communication: No apparent difficulties   Cognition Arousal: Alert Behavior During Therapy: WFL for tasks assessed/performed Cognition: No apparent impairments                               Following commands: Intact                  Home Living Family/patient expects to be discharged to:: Private residence Living Arrangements: Spouse/significant other Available Help at Discharge: Available 24 hours/day Type of Home: Independent living facility ([facility] Home Access: Level entry     Home Layout: One level  Home Equipment: Agricultural consultant (2 wheels)          Prior Functioning/Environment Prior Level of Function : Independent/Modified Independent                            OT Goals(Current goals can be found in the care plan section)   Acute Rehab OT Goals Patient Stated Goal: to go homw OT Goal Formulation: With patient Time For Goal Achievement: 11/29/23 Potential to Achieve Goals: Fair   AM-PAC OT 6 Clicks Daily Activity     Outcome Measure Help from another person eating meals?: None Help from another person taking care of personal grooming?: None Help from another person toileting, which includes using toliet, bedpan, or urinal?: None Help from another person bathing (including washing, rinsing, drying)?: None Help from another person to put on and taking off regular upper body clothing?: None Help from another person to put on and taking off regular lower body clothing?: None 6 Click Score: 24   End of Session Nurse  Communication: Mobility status  Activity Tolerance: Patient tolerated treatment well Patient left: in chair                   Time: 9164-9154 OT Time Calculation (min): 10 min Charges:  OT General Charges $OT Visit: 1 Visit OT Evaluation $OT Eval Low Complexity: 1 Low  Izetta Claude, MS, OTR/L , CBIS ascom [phone]  11/29/23, 8:51 AM

## 2023-11-29 NOTE — Discharge Summary (Signed)
 Physician Discharge Summary  Deborah Harrison FMW:[id number] DOB: 30-Dec-1945 DOA: 11/28/2023  PCP: Pcp, No  Admit date: 11/28/2023 Discharge date: 11/29/2023   Recommendations for Outpatient Follow-up:  Follow up with PCP in 1 week review your blood pressure log and determine if your amlodipine dose should be higher or lower.  Ideally your systolic blood pressure should be between 120 and 140  Hospital Course: Deborah Harrison is a 78 year old female with osteoporosis, hyperlipidemia, history of squamous cell skin cancer, who presented to the ED from [facility] clinic with dizziness and transient left facial and arm numbness with burning paresthesias.  Patient has had multiple episodes similar to this which are occasionally accompanied by presyncope.  Typically episodes resolve spontaneously.  On this occurrence her symptoms gradually resolved but she had persistent dizziness.  She is found to be very hypertensive in the ED with an SBP of over 200.  Head CT was unremarkable.  Brain MRI revealed no acute intracranial abnormality but noted mild chronic microvascular ischemic changes.  CTA head and neck with no LVO but did note severe stenosis of the small distal V2 vertebral arteries and small basilar artery nearly occlusive.  The PCAs are fetal type bilaterally and patent.  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  She was admitted for TIA workup.  Workup has otherwise been nonrevealing.  Echocardiogram with preserved EF, no significant valvular dysfunction, no shunting appreciated, grade 1 diastolic dysfunction.  No events on cardiac telemetry.  Hemoglobin A1c at goal.  LDL elevated to 141. Blood pressure resolved without significant intervention. SBPs remain slightly above goal and she was started on low-dose amlodipine.   Neurology was consulted and has recommended increasing atorvastatin and initiating DAPT indefinitely.  Patient  had no skilled therapy needs. Cervical MRI to evaluate for cervical radiculopathy with atypical radiation of symptoms was performed but read is still pending at time of discharge.  Patient requested to discharge home and requested a call for results. 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  She was advised to avoid hypotension and maintain a long-term SBP goal of 120-140. Care was discussed extensively with the patient and her husband at bedside.  They endorsed understanding.  They have close outpatient follow-up with their PCP and will continue to keep a blood pressure log at home.  Presyncopal episodes TIA versus capsular warning syndrome - Neurological exam is nonfocal.  - CT head: Unremarkable CT appearance of the brain for age.  - MRI brain: No acute intracranial abnormality. Mild chronic microvascular ischemic changes. - CTA of head and neck: No emergent large vessel occlusion. 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 The PCAs are fetal type bilaterally and patent. 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 Moderate stenosis of the right paraclinoid ICA. -Echocardiogram with preserved EF, no significant valvular dysfunction, no shunting appreciated, grade 1 diastolic dysfunction - Hemoglobin A1c within normal limits - LDL elevated above goal, increase atorvastatin to 40 mg daily - CK unremarkable - Cervical MRI to rule out cervical radiculopathy:pending read at this time. Will call patient with results - Advised to discontinue [place] supplementation as this can interact with antihypertensives and antiplatelet medications  Hypertensive urgency - Blood pressure 195/84 on arrival, resolved spontaneously.  Has been labile.  SBP 130s to 170s - Initiate amlodipine 5 mg - Keep a blood  pressure log outpatient - Review log with PCP - Goal SBP  120-140.  Hyperlipidemia - Atorvastatin 40 mg at discharge  Heart failure with preserved EF - Echocardiogram reveals grade 1 diastolic dysfunction.  Reviewed this with the patient.  She is clinically euvolemic at this time. - BP management as above  Osteoporosis - Continue vitamin D Discharge Instructions  Discharge Instructions     Call MD for:  difficulty breathing, headache or visual disturbances   Complete by: As directed    Call MD for:  persistant dizziness or light-headedness   Complete by: As directed    Call MD for:  persistant nausea and vomiting   Complete by: As directed    Call MD for:  severe uncontrolled pain   Complete by: As directed    Call MD for:  temperature >100.4   Complete by: As directed    Diet general   Complete by: As directed    Discharge instructions   Complete by: As directed    You are being sent home with 4 new medications. Atorvastatin: This is for your elevated cholesterol.  Please take at night. Aspirin and clopidogrel (Plavix): These are antiplatelet medications and act as blood thinners.  You will need to be on these medications indefinitely. Amlodipine: This is a blood pressure medication.  Please check your blood pressure once daily and keep a log.  Please follow-up with your primary care physician to review this log.  Optimally your systolic (top number) blood pressure should be between 120 and 140.  Please avoid getting dehydrated.  You have been started on 5 mg of amlodipine.  This medication can go up or down based on your blood pressure needs.  Follow up with your primary care physician to discuss the medication changes during this admission   Increase activity slowly   Complete by: As directed       Allergies as of 11/29/2023       Reactions   Penicillins Itching, Swelling, Rash   Severe   Erythromycin Other (See Comments)   GI Intolerance        Medication List     STOP taking these medications    HAWTHORN EXTRACT  PO   VITAMIN D-3 PO       TAKE these medications    acetaminophen  500 MG tablet Commonly known as: TYLENOL  Take 1,000 mg by mouth every 6 (six) hours as needed.   amLODipine 5 MG tablet Commonly known as: NORVASC Take 1 tablet (5 mg total) by mouth daily.   aspirin EC 81 MG tablet Take 1 tablet (81 mg total) by mouth daily. Swallow whole. Start taking on: November 30, 2023   atorvastatin 40 MG tablet Commonly known as: LIPITOR Take 1 tablet (40 mg total) by mouth daily. Start taking on: November 30, 2023   clopidogrel 75 MG tablet Commonly known as: PLAVIX Take 1 tablet (75 mg total) by mouth daily. Start taking on: November 30, 2023        Allergies  Allergen Reactions   Penicillins Itching, Swelling and Rash    Severe   Erythromycin Other (See Comments)    GI Intolerance     Consultations: Treatment Team:  Lindzen, Eric, MD   Procedures/Studies: ECHOCARDIOGRAM COMPLETE Result Date: 11/29/2023    ECHOCARDIOGRAM REPORT   Patient Name:   Deborah Harrison Date of Exam: 11/29/2023 Medical Rec #:  [id number]   Height:       65.0 in Accession #:    [id number]  Weight:       110.2 lb Date of Birth:  09/13/1945   BSA:          1.536 m Patient Age:    24 years    BP:           139/91 mmHg Patient Gender: F           HR:           79 bpm. Exam Location:  [facility] Procedure: 2D Echo, Cardiac Doppler and Color Doppler (Both Spectral and Color            Flow Doppler were utilized during procedure). Indications:     TIA G45.9  History:         Patient has no prior history of Echocardiogram examinations.  Sonographer:     Thedora Louder RDCS, FASE Referring Phys:  [id number] CAMILA A THOMAS Diagnosing Phys: Cara JONETTA Lovelace MD IMPRESSIONS  1. Left ventricular ejection fraction, by estimation, is 55 to 60%. The left ventricle has normal function. The left ventricle has no regional wall motion abnormalities. Left ventricular diastolic parameters are consistent with Grade I diastolic dysfunction  (impaired relaxation).  2. Right ventricular systolic function is normal. The right ventricular size is normal.  3. The mitral valve is normal in structure. Trivial mitral valve regurgitation.  4. The aortic valve is normal in structure. Aortic valve regurgitation is trivial. FINDINGS  Left Ventricle: Left ventricular ejection fraction, by estimation, is 55 to 60%. The left ventricle has normal function. The left ventricle has no regional wall motion abnormalities. Strain was performed and the global longitudinal strain is indeterminate. Global longitudinal strain performed but not reported based on interpreter judgement due to suboptimal tracking. The left ventricular internal cavity size was normal in size. There is no left ventricular hypertrophy. Left ventricular diastolic parameters are consistent with Grade I diastolic dysfunction (impaired relaxation). Right Ventricle: The right ventricular size is normal. No increase in right ventricular wall thickness. Right ventricular systolic function is normal. Left Atrium: Left atrial size was normal in size. Right Atrium: Right atrial size was normal in size. Pericardium: There is no evidence of pericardial effusion. Mitral Valve: The mitral valve is normal in structure. Trivial mitral valve regurgitation. Tricuspid Valve: The tricuspid valve is normal in structure. Tricuspid valve regurgitation is trivial. Aortic Valve: The aortic valve is normal in structure. Aortic valve regurgitation is trivial. Aortic valve peak gradient measures 9.2 mmHg. Pulmonic Valve: The pulmonic valve was normal in structure. Pulmonic valve regurgitation is not visualized. Aorta: The ascending aorta was not well visualized. IAS/Shunts: No atrial level shunt detected by color flow Doppler. Additional Comments: 3D was performed not requiring image post processing on an independent workstation and was indeterminate.  LEFT VENTRICLE PLAX 2D LVIDd:         3.30 cm   Diastology LVIDs:          2.30 cm   LV e' medial:    6.74 cm/s LV PW:         0.90 cm   LV E/e' medial:  7.9 LV IVS:        0.90 cm   LV e' lateral:   9.57 cm/s LVOT diam:     1.80 cm   LV E/e' lateral: 5.6 LV SV:         73 LV SV Index:   48 LVOT Area:     2.54 cm  RIGHT VENTRICLE RV Basal diam:  2.50 cm RV S prime:  17.00 cm/s TAPSE (M-mode): 2.2 cm LEFT ATRIUM             Index       RIGHT ATRIUM           Index LA diam:        1.90 cm 1.24 cm/m  RA Area:     10.30 cm LA Vol (A2C):   13.2 ml 8.60 ml/m  RA Volume:   20.90 ml  13.61 ml/m LA Vol (A4C):   12.8 ml 8.34 ml/m LA Biplane Vol: 13.6 ml 8.86 ml/m  AORTIC VALVE                 PULMONIC VALVE AV Area (Vmax): 2.34 cm     PV Vmax:        0.96 m/s AV Vmax:        152.00 cm/s  PV Peak grad:   3.7 mmHg AV Peak Grad:   9.2 mmHg     RVOT Peak grad: 3 mmHg LVOT Vmax:      140.00 cm/s LVOT Vmean:     93.800 cm/s LVOT VTI:       0.287 m  AORTA Ao Root diam: 3.10 cm Ao Asc diam:  2.70 cm MITRAL VALVE               TRICUSPID VALVE MV Area (PHT): 4.12 cm    TV Peak grad:   25.1 mmHg MV Decel Time: 184 msec    TV Vmax:        2.50 m/s MV E velocity: 53.50 cm/s MV A velocity: 66.60 cm/s  SHUNTS MV E/A ratio:  0.80        Systemic VTI:  0.29 m                            Systemic Diam: 1.80 cm Cara JONETTA Lovelace MD Electronically signed by Cara JONETTA Lovelace MD Signature Date/Time: 11/29/2023/3:16:53 PM    Final    CT ANGIO HEAD NECK W WO CM Result Date: 11/28/2023 CLINICAL DATA:  Stroke/TIA, assess for intracardiac shunt. EXAM: CT ANGIOGRAPHY HEAD AND NECK WITH AND WITHOUT CONTRAST TECHNIQUE: Multidetector CT imaging of the head and neck was performed using the standard protocol during bolus administration of intravenous contrast. Multiplanar CT image reconstructions and MIPs were obtained to evaluate the vascular anatomy. Carotid stenosis measurements (when applicable) are obtained utilizing NASCET criteria, using the distal internal carotid diameter as the denominator. RADIATION DOSE  REDUCTION: This exam was performed according to the departmental dose-optimization program which includes automated exposure control, adjustment of the mA and/or kV according to patient size and/or use of iterative reconstruction technique. CONTRAST:  75mL OMNIPAQUE IOHEXOL 350 MG/ML SOLN COMPARISON:  Same day CT and MRI head. FINDINGS: CTA NECK FINDINGS Aortic arch: The great vessel origins are incompletely imaged. Right carotid system: No evidence of dissection, stenosis (50% or greater), or occlusion. Left carotid system: No evidence of dissection, stenosis (50% or greater), or occlusion. Vertebral arteries: Right dominant. No evidence of dissection, stenosis (50% or greater), or occlusion. Irregularity of the distal V2/V3 vertebral artery. Skeleton: No evidence of acute abnormality on limited assessment. Other neck: Negative. Upper chest: Visualized lung apices are clear. Review of the MIP images confirms the above findings CTA HEAD FINDINGS Anterior circulation: Bilateral intracranial ICAs, MCAs and ACAs are patent. Hypoplastic right A1 ACA. Severe left and moderate right paraclinoid ICA stenosis. Severe proximal left M1 MCA stenosis. Posterior circulation:  Severe (nearly occlusive) stenosis of the distal right intradural vertebral artery. Severe left intradural vertebral artery stenosis. Additional severe (nearly occlusive) stenosis of the basilar artery. Bilateral fetal type PCAs with small vertebrobasilar system, anatomic variant. Both PCAs are patent. Venous sinuses: As permitted by contrast timing, patent. Review of the MIP images confirms the above findings IMPRESSION: 1. No emergent large vessel occlusion. 2. 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 The PCAs are fetal type bilaterally and patent. 3. 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 4. Moderate stenosis of the right paraclinoid ICA. Electronically Signed    By: Gilmore GORMAN Molt M.D.   On: 11/28/2023 18:57   MR BRAIN WO CONTRAST Result Date: 11/28/2023 CLINICAL DATA:  Transient ischemic attack, weird sensation in left eye and left arm starting 3 weeks ago. Dizziness. EXAM: MRI HEAD WITHOUT CONTRAST TECHNIQUE: Multiplanar, multiecho pulse sequences of the brain and surrounding structures were obtained without intravenous contrast. COMPARISON:  CT head earlier same day. FINDINGS: Brain: No acute infarct. No evidence of intracranial hemorrhage. Scattered foci of T2/FLAIR hyperintensity in the periventricular and subcortical white matter. No edema, mass effect, or midline shift. Posterior fossa is unremarkable. Normal appearance of midline structures. The basilar cisterns are patent. No extra-axial fluid collections. Ventricles: Normal size and configuration of the ventricles. Vascular: Skull base flow voids are visualized. Skull and upper cervical spine: No focal abnormality. Sinuses/Orbits: Orbits are symmetric. Paranasal sinuses are clear. Other: Mastoid air cells are clear. IMPRESSION: No acute intracranial abnormality. Mild chronic microvascular ischemic changes. Electronically Signed   By: Donnice Mania M.D.   On: 11/28/2023 14:54   CT HEAD WO CONTRAST Result Date: 11/28/2023 CLINICAL DATA:  Recent episodes of tingling involving the left periorbital region of both arms. Dizziness. EXAM: CT HEAD WITHOUT CONTRAST TECHNIQUE: Contiguous axial images were obtained from the base of the skull through the vertex without intravenous contrast. RADIATION DOSE REDUCTION: This exam was performed according to the departmental dose-optimization program which includes automated exposure control, adjustment of the mA and/or kV according to patient size and/or use of iterative reconstruction technique. COMPARISON:  None Available. FINDINGS: Brain: There is no evidence of an acute infarct, intracranial hemorrhage, mass, midline shift, or extra-axial fluid collection. Cerebral  volume is within normal limits for age. The ventricles are normal in size. Vascular: Calcified atherosclerosis at the skull base. No hyperdense vessel. Skull: No fracture or suspicious lesion. Sinuses/Orbits: Visualized paranasal sinuses and mastoid air cells are well aerated. Unremarkable included orbits. Other: None. IMPRESSION: Unremarkable CT appearance of the brain for age. Electronically Signed   By: Dasie Hamburg M.D.   On: 11/28/2023 09:44      Discharge Exam: Vitals:   11/29/23 1200 11/29/23 1600  BP: (!) 152/81 (!) 171/88  Pulse: 77 68  Resp: 18 18  Temp: 98.3 F (36.8 C) 98.1 F (36.7 C)  SpO2: 98% 99%   Vitals:   11/29/23 0400 11/29/23 0809 11/29/23 1200 11/29/23 1600  BP: (!) 139/91 (!) 146/80 (!) 152/81 (!) 171/88  Pulse: 69 75 77 68  Resp: 16 18 18 18   Temp: 98.6 F (37 C) 98.9 F (37.2 C) 98.3 F (36.8 C) 98.1 F (36.7 C)  TempSrc:  Oral    SpO2: 96% 98% 98% 99%  Weight:      Height:        Constitutional:  Normal appearance. Non toxic-appearing.  HENT: Head Normocephalic and atraumatic.  Mucous membranes  are moist.  Eyes:  Extraocular intact. Conjunctivae normal.  Cardiovascular: Rate and Rhythm: Normal rate and regular rhythm.  Pulmonary: Non labored, symmetric rise of chest wall.  Skin: warm and dry. not jaundiced.  Neurological: No focal deficit present. alert. Oriented.  Psychiatric: Mood and Affect congruent.    The results of significant diagnostics from this hospitalization (including imaging, microbiology, ancillary and laboratory) are listed below for reference.     Microbiology: No results found for this or any previous visit (from the past 240 hours).   Labs: BNP (last 3 results) No results for input(s): BNP in the last 8760 hours. Basic Metabolic Panel: Recent Labs  Lab 11/28/23 0841 11/28/23 1311  NA 142  --   K 4.1  --   CL 107  --   CO2 28  --   GLUCOSE 100*  --   BUN 17  --   CREATININE 0.53 0.44  CALCIUM 9.0  --     Liver Function Tests: Recent Labs  Lab 11/28/23 0841  AST 22  ALT 14  ALKPHOS 82  BILITOT 1.0  PROT 6.5  ALBUMIN 3.9   No results for input(s): LIPASE, AMYLASE in the last 168 hours. No results for input(s): AMMONIA in the last 168 hours. CBC: Recent Labs  Lab 11/28/23 0841 11/28/23 1311  WBC 5.7 6.7  NEUTROABS 3.3  --   HGB 13.5 12.8  HCT 41.5 38.0  MCV 99.3 97.2  PLT 235 226   Cardiac Enzymes: Recent Labs  Lab 11/29/23 0604  CKTOTAL 68   BNP: Invalid input(s): POCBNP CBG: Recent Labs  Lab 11/28/23 0840  GLUCAP 90   D-Dimer No results for input(s): DDIMER in the last 72 hours. Hgb A1c Recent Labs    11/28/23 1311  HGBA1C 5.4   Lipid Profile Recent Labs    11/29/23 0604  CHOL 233*  HDL 78  LDLCALC 141*  TRIG 72  CHOLHDL 3.0   Thyroid function studies No results for input(s): TSH, T4TOTAL, T3FREE, THYROIDAB in the last 72 hours.  Invalid input(s): FREET3 Anemia work up [employer]    11/29/23 0841  VITAMINB12 410   Urinalysis No results found for: COLORURINE, APPEARANCEUR, LABSPEC, PHURINE, GLUCOSEU, HGBUR, BILIRUBINUR, KETONESUR, PROTEINUR, UROBILINOGEN, NITRITE, LEUKOCYTESUR Sepsis Labs Recent Labs  Lab 11/28/23 0841 11/28/23 1311  WBC 5.7 6.7   Microbiology No results found for this or any previous visit (from the past 240 hours).   Time coordinating discharge: 32 min   SIGNED: Lorane Poland, MD  [facility] 11/29/2023, 5:19 PM Pager   If 7PM-7AM, please contact night-coverage

## 2023-11-29 NOTE — Progress Notes (Signed)
  Echocardiogram 2D Echocardiogram has been performed.  Deborah Harrison Louder 11/29/2023, 1:22 PM

## 2023-11-29 NOTE — Plan of Care (Signed)
  Problem: Education: Goal: Knowledge of disease or condition will improve Outcome: Progressing Goal: Knowledge of secondary prevention will improve (MUST DOCUMENT ALL) Outcome: Progressing Goal: Knowledge of patient specific risk factors will improve (DELETE if not current risk factor) Outcome: Progressing   Problem: Ischemic Stroke/TIA Tissue Perfusion: Goal: Complications of ischemic stroke/TIA will be minimized Outcome: Progressing   Problem: Coping: Goal: Will verbalize positive feelings about self Outcome: Progressing Goal: Will identify appropriate support needs Outcome: Progressing   Problem: Health Behavior/Discharge Planning: Goal: Ability to manage health-related needs will improve Outcome: Progressing Goal: Goals will be collaboratively established with patient/family Outcome: Progressing   Problem: Self-Care: Goal: Ability to participate in self-care as condition permits will improve Outcome: Progressing Goal: Verbalization of feelings and concerns over difficulty with self-care will improve Outcome: Progressing Goal: Ability to communicate needs accurately will improve Outcome: Progressing   Problem: Nutrition: Goal: Risk of aspiration will decrease Outcome: Progressing Goal: Dietary intake will improve Outcome: Progressing   Problem: Education: Goal: Knowledge of General Education information will improve Description: Including pain rating scale, medication(s)/side effects and non-pharmacologic comfort measures Outcome: Progressing   Problem: Health Behavior/Discharge Planning: Goal: Ability to manage health-related needs will improve Outcome: Progressing   Problem: Clinical Measurements: Goal: Ability to maintain clinical measurements within normal limits will improve Outcome: Progressing Goal: Will remain free from infection Outcome: Progressing Goal: Diagnostic test results will improve Outcome: Progressing Goal: Respiratory complications will  improve Outcome: Progressing Goal: Cardiovascular complication will be avoided Outcome: Progressing   Problem: Activity: Goal: Risk for activity intolerance will decrease Outcome: Progressing

## 2023-11-30 ENCOUNTER — Ambulatory Visit: Payer: Self-pay | Admitting: Family Medicine

## 2024-03-25 ENCOUNTER — Other Ambulatory Visit: Payer: Self-pay | Admitting: Family Medicine

## 2024-03-25 DIAGNOSIS — Z1231 Encounter for screening mammogram for malignant neoplasm of breast: Secondary | ICD-10-CM

## 2024-04-27 ENCOUNTER — Ambulatory Visit
Admission: RE | Admit: 2024-04-27 | Discharge: 2024-04-27 | Disposition: A | Discharge status: Home / Self Care | Source: Ambulatory Visit | Attending: Family Medicine | Admitting: Family Medicine

## 2024-04-27 DIAGNOSIS — Z1231 Encounter for screening mammogram for malignant neoplasm of breast: Secondary | ICD-10-CM | POA: Diagnosis present

## 2024-07-20 ENCOUNTER — Encounter: Payer: Medicare PPO | Admitting: Dermatology
